# Patient Record
Sex: Female | Born: 1985
Health system: Southern US, Community
[De-identification: ages and names within clinical notes are randomized; demographics above are authoritative.]

## PROBLEM LIST (undated history)

## (undated) DIAGNOSIS — K2970 Gastritis, unspecified, without bleeding: Secondary | ICD-10-CM

## (undated) DIAGNOSIS — J45909 Unspecified asthma, uncomplicated: Secondary | ICD-10-CM

## (undated) HISTORY — PX: CERVIX SURGERY: SHX593

---

## 2013-07-15 HISTORY — PX: GYNECOLOGIC CRYOSURGERY: SHX857

## 2016-07-23 ENCOUNTER — Encounter (HOSPITAL_COMMUNITY): Payer: Self-pay | Admitting: *Deleted

## 2016-07-23 ENCOUNTER — Ambulatory Visit (HOSPITAL_COMMUNITY)
Admission: EM | Admit: 2016-07-23 | Discharge: 2016-07-23 | Disposition: A | Discharge status: Home / Self Care | Payer: BLUE CROSS/BLUE SHIELD | Attending: Family Medicine | Admitting: Family Medicine

## 2016-07-23 DIAGNOSIS — K2950 Unspecified chronic gastritis without bleeding: Secondary | ICD-10-CM | POA: Diagnosis not present

## 2016-07-23 DIAGNOSIS — K219 Gastro-esophageal reflux disease without esophagitis: Secondary | ICD-10-CM | POA: Diagnosis not present

## 2016-07-23 HISTORY — DX: Unspecified asthma, uncomplicated: J45.909

## 2016-07-23 HISTORY — DX: Gastritis, unspecified, without bleeding: K29.70

## 2016-07-23 MED ORDER — GI COCKTAIL ~~LOC~~
30.0000 mL | Freq: Once | ORAL | Status: AC
  Administered 2016-07-23: 30 mL via ORAL

## 2016-07-23 MED ORDER — GI COCKTAIL ~~LOC~~
ORAL | Status: AC
  Filled 2016-07-23: qty 30

## 2016-07-23 MED ORDER — ESOMEPRAZOLE MAGNESIUM 40 MG PO CPDR: 40.0000 mg | DELAYED_RELEASE_CAPSULE | Freq: Every day | ORAL | 0 refills | Status: DC

## 2016-07-23 NOTE — ED Notes (Signed)
maggiie  In to  Help  interpret

## 2016-07-23 NOTE — ED Provider Notes (Signed)
CSN: [id number]     Arrival date & time 07/23/16  1329 History   First MD Initiated Contact with Patient 07/23/16 1543     Chief Complaint  Patient presents with  . Abdominal Pain   (Consider location/radiation/quality/duration/timing/severity/associated sxs/prior Treatment) 31 year old female with long history of gastritis in [place] is complaining of epigastric discomfort associated with reflux and heartburn this started this morning. Denies nausea, vomiting or fevers. She states she took 2 Zantac this morning without relief. Denies pain in the lower abdomen or pelvis.      Past Medical History:  Diagnosis Date  . Asthma   . Gastritis    History reviewed. No pertinent surgical history. History reviewed. No pertinent family history. Social History  Substance Use Topics  . Smoking status: Never Smoker  . Smokeless tobacco: Never Used  . Alcohol use No   OB History    No data available     Review of Systems  Constitutional: Negative.   HENT: Negative.   Respiratory: Negative.   Cardiovascular: Negative.   Gastrointestinal: Negative for constipation, nausea and vomiting.       As per history of present illness  Genitourinary: Negative.   Skin: Negative.   Neurological: Negative.   All other systems reviewed and are negative.   Allergies  Patient has no known allergies.  Home Medications   Prior to Admission medications   Medication Sig Start Date End Date Taking? Authorizing Provider  RANITIDINE HCL PO Take by mouth.   Yes Historical Provider, MD  esomeprazole (NEXIUM) 40 MG capsule Take 1 capsule (40 mg total) by mouth daily. 07/23/16   Hayden Rasmussen, NP   Meds Ordered and Administered this Visit   Medications  gi cocktail (Maalox,Lidocaine,Donnatal) (not administered)    BP 102/64 (BP Location: Right Arm)   Pulse 62   Temp 98.5 F (36.9 C) (Oral)   Resp 18   LMP 06/30/2016   SpO2 100%  No data found.   Physical Exam  Constitutional: She is oriented  to person, place, and time. She appears well-developed and well-nourished. No distress.  Neck: Neck supple.  Cardiovascular: Normal rate, regular rhythm, normal heart sounds and intact distal pulses.   Pulmonary/Chest: Effort normal and breath sounds normal.  Abdominal: Soft. Bowel sounds are normal. She exhibits no distension.  Mild tenderness in the epigastrium. No tenderness elsewhere. No guarding or rebound.  Musculoskeletal: Normal range of motion. She exhibits no edema.  Neurological: She is alert and oriented to person, place, and time.  Skin: Skin is warm and dry.  Psychiatric: She has a normal mood and affect.  Nursing note and vitals reviewed.   Urgent Care Course   Clinical Course     Procedures (including critical care time)  Labs Review Labs Reviewed - No data to display  Imaging Review No results found.   Visual Acuity Review  Right Eye Distance:   Left Eye Distance:   Bilateral Distance:    Right Eye Near:   Left Eye Near:    Bilateral Near:         MDM   1. Gastroesophageal reflux disease, esophagitis presence not specified   2. Chronic gastritis without bleeding, unspecified gastritis type    Continue taking the Zantac twice a day. Start taking the Nexium 40 mg on a daily basis. Stop eating foods that are greasy, spicy, acidic or fatty. Do not eat within 2 hours going to bed. Your stomach problems may continue and he will need a primary  care provider for treatment and possibly additional testing. Call the telephone numbers listed in your instructions for assistance in obtaining a primary care provider. Meds ordered this encounter  Medications  . RANITIDINE HCL PO    Sig: Take by mouth.  . gi cocktail (Maalox,Lidocaine,Donnatal)  . esomeprazole (NEXIUM) 40 MG capsule    Sig: Take 1 capsule (40 mg total) by mouth daily.    Dispense:  30 capsule    Refill:  0    Order Specific Question:   Supervising Provider    Answer:   Linna HoffKINDL, JAMES D [[id number]]        Hayden Rasmussenavid Curley Hogen, NP 07/23/16 [phone]

## 2016-07-23 NOTE — ED Triage Notes (Signed)
Pt  Reports   Symptoms  Of   Shortness  Of  Breath         With symptoms    Of  Reflux     With  Onset this    Am    Pt  Has   Some  Nausea   As  Well

## 2016-07-23 NOTE — Discharge Instructions (Signed)
Continue taking the Zantac twice a day. Start taking the Nexium 40 mg on a daily basis. Stop eating foods that are greasy, spicy, acidic or fatty. Do not eat within 2 hours going to bed. Your stomach problems may continue and he will need a primary care provider for treatment and possibly additional testing. Call the telephone numbers listed in your instructions for assistance in obtaining a primary care provider.

## 2016-10-29 ENCOUNTER — Emergency Department (HOSPITAL_COMMUNITY)
Admission: EM | Admit: 2016-10-29 | Discharge: 2016-10-29 | Disposition: A | Discharge status: Home / Self Care | Payer: BLUE CROSS/BLUE SHIELD | Attending: Emergency Medicine | Admitting: Emergency Medicine

## 2016-10-29 ENCOUNTER — Emergency Department (HOSPITAL_COMMUNITY): Payer: BLUE CROSS/BLUE SHIELD

## 2016-10-29 ENCOUNTER — Encounter (HOSPITAL_COMMUNITY): Payer: Self-pay | Admitting: Emergency Medicine

## 2016-10-29 DIAGNOSIS — J45909 Unspecified asthma, uncomplicated: Secondary | ICD-10-CM | POA: Insufficient documentation

## 2016-10-29 DIAGNOSIS — Y939 Activity, unspecified: Secondary | ICD-10-CM | POA: Diagnosis not present

## 2016-10-29 DIAGNOSIS — Y92410 Unspecified street and highway as the place of occurrence of the external cause: Secondary | ICD-10-CM | POA: Insufficient documentation

## 2016-10-29 DIAGNOSIS — Y999 Unspecified external cause status: Secondary | ICD-10-CM | POA: Insufficient documentation

## 2016-10-29 DIAGNOSIS — Z79899 Other long term (current) drug therapy: Secondary | ICD-10-CM | POA: Insufficient documentation

## 2016-10-29 DIAGNOSIS — S0990XA Unspecified injury of head, initial encounter: Secondary | ICD-10-CM | POA: Diagnosis present

## 2016-10-29 DIAGNOSIS — M542 Cervicalgia: Secondary | ICD-10-CM | POA: Diagnosis not present

## 2016-10-29 DIAGNOSIS — R072 Precordial pain: Secondary | ICD-10-CM | POA: Insufficient documentation

## 2016-10-29 DIAGNOSIS — R51 Headache: Secondary | ICD-10-CM | POA: Insufficient documentation

## 2016-10-29 MED ORDER — CYCLOBENZAPRINE HCL 10 MG PO TABS: 10.0000 mg | ORAL_TABLET | Freq: Two times a day (BID) | ORAL | 0 refills | Status: DC | PRN

## 2016-10-29 MED ORDER — HYDROCODONE-ACETAMINOPHEN 5-325 MG PO TABS
1.0000 | ORAL_TABLET | Freq: Once | ORAL | Status: AC
  Administered 2016-10-29: 1 via ORAL
  Filled 2016-10-29: qty 1

## 2016-10-29 MED ORDER — OXYCODONE-ACETAMINOPHEN 5-325 MG PO TABS: 1.0000 | ORAL_TABLET | Freq: Four times a day (QID) | ORAL | 0 refills | Status: DC | PRN
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
## 2016-10-29 NOTE — ED Notes (Signed)
Bed: WTR6 Expected date:  Expected time:  Means of arrival:  Comments: 

## 2017-01-01 ENCOUNTER — Emergency Department (HOSPITAL_COMMUNITY)
Admission: EM | Admit: 2017-01-01 | Discharge: 2017-01-01 | Disposition: A | Discharge status: Home / Self Care | Payer: BLUE CROSS/BLUE SHIELD | Attending: Emergency Medicine | Admitting: Emergency Medicine

## 2017-01-01 ENCOUNTER — Encounter (HOSPITAL_COMMUNITY): Payer: Self-pay

## 2017-01-01 DIAGNOSIS — J45909 Unspecified asthma, uncomplicated: Secondary | ICD-10-CM | POA: Diagnosis not present

## 2017-01-01 DIAGNOSIS — K529 Noninfective gastroenteritis and colitis, unspecified: Secondary | ICD-10-CM | POA: Diagnosis not present

## 2017-01-01 DIAGNOSIS — R112 Nausea with vomiting, unspecified: Secondary | ICD-10-CM | POA: Diagnosis present

## 2017-01-01 LAB — COMPREHENSIVE METABOLIC PANEL
ALT: 31 U/L (ref 14–54)
AST: 31 U/L (ref 15–41)
Albumin: 4.3 g/dL (ref 3.5–5.0)
Alkaline Phosphatase: 66 U/L (ref 38–126)
Anion gap: 11 (ref 5–15)
BILIRUBIN TOTAL: 1.1 mg/dL (ref 0.3–1.2)
BUN: 14 mg/dL (ref 6–20)
CO2: 19 mmol/L — ABNORMAL LOW (ref 22–32)
Calcium: 9.2 mg/dL (ref 8.9–10.3)
Chloride: 109 mmol/L (ref 101–111)
Creatinine, Ser: 0.86 mg/dL (ref 0.44–1.00)
GFR calc Af Amer: >60 mL/min (ref >60)
Glucose, Bld: 112 mg/dL — ABNORMAL HIGH (ref 65–99)
POTASSIUM: 3.8 mmol/L (ref 3.5–5.1)
Sodium: 139 mmol/L (ref 135–145)
TOTAL PROTEIN: 7.4 g/dL (ref 6.5–8.1)

## 2017-01-01 LAB — CBC
HEMATOCRIT: 42 % (ref 36.0–46.0)
HEMOGLOBIN: 14 g/dL (ref 12.0–15.0)
MCH: 26.9 pg (ref 26.0–34.0)
MCHC: 33.3 g/dL (ref 30.0–36.0)
MCV: 80.8 fL (ref 78.0–100.0)
Platelets: 319 10*3/uL (ref 150–400)
RBC: 5.2 MIL/uL — AB (ref 3.87–5.11)
RDW: 13.9 % (ref 11.5–15.5)
WBC: 12.7 10*3/uL — AB (ref 4.0–10.5)

## 2017-01-01 LAB — LIPASE, BLOOD: LIPASE: 22 U/L (ref 11–51)

## 2017-01-01 LAB — I-STAT BETA HCG BLOOD, ED (MC, WL, AP ONLY): I-stat hCG, quantitative: <5 m[IU]/mL (ref <5)

## 2017-01-01 MED ORDER — ONDANSETRON 4 MG PO TBDP
8.0000 mg | ORAL_TABLET | Freq: Once | ORAL | Status: AC
  Administered 2017-01-01: 8 mg via ORAL
  Filled 2017-01-01: qty 2

## 2017-01-01 MED ORDER — HYDROCODONE-ACETAMINOPHEN 5-325 MG PO TABS
1.0000 | ORAL_TABLET | Freq: Once | ORAL | Status: AC
  Administered 2017-01-01: 1 via ORAL
  Filled 2017-01-01: qty 1

## 2017-01-01 MED ORDER — ONDANSETRON HCL 8 MG PO TABS: 8.0000 mg | ORAL_TABLET | Freq: Three times a day (TID) | ORAL | 0 refills | Status: DC | PRN

## 2017-01-01 NOTE — ED Triage Notes (Signed)
Pt reports abdominal pain, nausea, vomiting and diarrhea, onset this morning.

## 2017-01-01 NOTE — Discharge Instructions (Signed)
Start with a clear liquid diet, then gradually advance to bland foods over the next 2 or 3 days.

## 2017-01-01 NOTE — ED Notes (Signed)
Pt is in stable condition upon d/c and ambulates from ED. 

## 2017-01-01 NOTE — ED Notes (Addendum)
Pt took odt zofran at home one hr pta

## 2017-01-01 NOTE — ED Provider Notes (Signed)
[facility]-EMERGENCY DEPT Provider Note   CSN: [id number] Arrival date & time: 01/01/17  0803     History   Chief Complaint Chief Complaint  Patient presents with  . Abdominal Pain    HPI Patricia [name] is a 31 y.o. female.  She presents for evaluation of nausea vomiting and diarrhea, nonbloody, for several hours, with an illness similar to her child's, who was recovering at this time.  She denies abdominal pain, back pain, weakness or dizziness.  There are no other known modifying factors.  HPI  Past Medical History:  Diagnosis Date  . Asthma   . Gastritis     There are no active problems to display for this patient.   History reviewed. No pertinent surgical history.  OB History    No data available       Home Medications    Prior to Admission medications   Medication Sig Start Date End Date Taking? Authorizing Provider  ondansetron (ZOFRAN-ODT) 4 MG disintegrating tablet Take 4 mg by mouth every 8 (eight) hours as needed for nausea or vomiting.   Yes [provider]  ondansetron (ZOFRAN) 8 MG tablet Take 1 tablet (8 mg total) by mouth every 8 (eight) hours as needed for nausea or vomiting. 01/01/17   Mancel BaleWentz, Yoshito Gaza, MD    Family History No family history on file.  Social History Social History  Substance Use Topics  . Smoking status: Never Smoker  . Smokeless tobacco: Never Used  . Alcohol use No     Allergies   Patient has no known allergies.   Review of Systems Review of Systems  All other systems reviewed and are negative.    Physical Exam Updated Vital Signs BP 120/75 (BP Location: Right Arm)   Pulse 88   Temp 98.5 F (36.9 C) (Oral)   Resp 18   LMP 12/24/2016   SpO2 100%   Physical Exam  Constitutional: She is oriented to person, place, and time. She appears well-developed and well-nourished. No distress.  HENT:  Head: Normocephalic and atraumatic.  Eyes: Conjunctivae and EOM are normal. Pupils are equal, round, and reactive  to light.  Neck: Normal range of motion and phonation normal. Neck supple.  Cardiovascular: Normal rate and regular rhythm.   Pulmonary/Chest: Effort normal and breath sounds normal. She exhibits no tenderness.  Abdominal: Soft. She exhibits no distension. There is no tenderness. There is no guarding.  Musculoskeletal: Normal range of motion.  Neurological: She is alert and oriented to person, place, and time. She exhibits normal muscle tone.  Skin: Skin is warm and dry.  Psychiatric: She has a normal mood and affect. Her behavior is normal. Judgment and thought content normal.  Nursing note and vitals reviewed.    ED Treatments / Results  Labs (all labs ordered are listed, but only abnormal results are displayed) Labs Reviewed  COMPREHENSIVE METABOLIC PANEL - Abnormal; Notable for the following:       Result Value   CO2 19 (*)    Glucose, Bld 112 (*)    All other components within normal limits  CBC - Abnormal; Notable for the following:    WBC 12.7 (*)    RBC 5.20 (*)    All other components within normal limits  LIPASE, BLOOD  URINALYSIS, ROUTINE W REFLEX MICROSCOPIC  I-STAT BETA HCG BLOOD, ED ([facility], [facility], [facility] ONLY)    EKG  EKG Interpretation None       Radiology No results found.  Procedures Procedures (including critical care  time)  Medications Ordered in ED Medications  HYDROcodone-acetaminophen (NORCO/VICODIN) 5-325 MG per tablet 1 tablet (1 tablet Oral Given 01/01/17 1006)  ondansetron (ZOFRAN-ODT) disintegrating tablet 8 mg (8 mg Oral Given 01/01/17 1005)     Initial Impression / Assessment and Plan / ED Course  I have reviewed the triage vital signs and the nursing notes.  Pertinent labs & imaging results that were available during my care of the patient were reviewed by me and considered in my medical decision making (see chart for details).      Patient Vitals for the past 24 hrs:  BP Temp Temp src Pulse Resp SpO2  01/01/17 0818 120/75 98.5 F (36.9  C) Oral 88 18 100 %    10:59 AM Reevaluation with update and discussion. After initial assessment and treatment, an updated evaluation reveals she feels "regular", at this time.  Findings discussed with the patient and all questions were answered. Patricia [name]    Final Clinical Impressions(s) / ED Diagnoses   Final diagnoses:  Gastroenteritis    Evaluation consistent with infectious gastroenteritis.  Patient improved with treatment, and stable for discharge.  Doubt serious bacterial infection, metabolic instability or impending vascular collapse.  Nursing Notes Reviewed/ Care Coordinated Applicable Imaging Reviewed Interpretation of Laboratory Data incorporated into ED treatment  The patient appears reasonably screened and/or stabilized for discharge and I doubt any other medical condition or other [facility] requiring further screening, evaluation, or treatment in the ED at this time prior to discharge.  Plan: Home Medications-OTC, as needed; Home Treatments-gradually advance diet; return here if the recommended treatment, does not improve the symptoms; Recommended follow up-PCP as needed    New Prescriptions New Prescriptions   ONDANSETRON (ZOFRAN) 8 MG TABLET    Take 1 tablet (8 mg total) by mouth every 8 (eight) hours as needed for nausea or vomiting.     Mancel Bale, MD 01/01/17 1110

## 2017-04-28 ENCOUNTER — Ambulatory Visit (HOSPITAL_COMMUNITY)
Admission: EM | Admit: 2017-04-28 | Discharge: 2017-04-28 | Disposition: A | Discharge status: Home / Self Care | Payer: BLUE CROSS/BLUE SHIELD | Attending: Emergency Medicine | Admitting: Emergency Medicine

## 2017-04-28 ENCOUNTER — Encounter (HOSPITAL_COMMUNITY): Payer: Self-pay | Admitting: Emergency Medicine

## 2017-04-28 DIAGNOSIS — R1084 Generalized abdominal pain: Secondary | ICD-10-CM | POA: Diagnosis not present

## 2017-04-28 DIAGNOSIS — K5900 Constipation, unspecified: Secondary | ICD-10-CM

## 2017-04-28 MED ORDER — POLYETHYLENE GLYCOL 3350 17 G PO PACK: 17.0000 g | PACK | Freq: Every day | ORAL | 0 refills | Status: DC

## 2017-04-28 MED ORDER — DOCUSATE SODIUM 50 MG PO CAPS: 50.0000 mg | ORAL_CAPSULE | Freq: Two times a day (BID) | ORAL | 0 refills | Status: DC

## 2017-04-28 NOTE — Discharge Instructions (Signed)
No alarming signs on exam. Symptoms could be due to constipation, viral illness, acid reflux. Take colace and miralax for constipation. You can continue zantac for acid reflux. I have attached information for acid reflux and foods to avoid. Keep hydrated, you urine should be clear to pale yellow in color. Follow up with PCP if symptoms do not improve. If experiencing worsening of symptoms, nausea/vomiting, blood in vomit, increased blood in stool, weakness, dizziness, go to the emergency department for further evaluation.

## 2017-04-28 NOTE — ED Triage Notes (Signed)
Pt sts rectal pain and some abd pain; pt sts hx of gastritis

## 2017-04-28 NOTE — ED Provider Notes (Signed)
[facility]    CSN: [id number] Arrival date & time: 04/28/17  1547     History   Chief Complaint Chief Complaint  Patient presents with  . Rectal Pain  . Abdominal Pain    HPI Patricia [name] is a 31 y.o. female.   31 year old female with history of asthma comes in for 1 day history of low abdominal pain and rectal pain. Low abdominal pain and rectal pain is intermittent and occurs together. She is also experiencing epigastric pain as well, states pain is worse with eating. Took some zantac without relief. Last BM this morning, straining with hard stools. Points to bristol chart type 2. Notices occasional blood when wiping. Had some nausea without vomiting yesterday. Has not had anything to eat today, but does feel hungry. Denies urinary symptoms such as frequency, dysuria, heamturia. Denies fever, chills, night sweats. Denies weakness, dizziness. No sick contact.       Past Medical History:  Diagnosis Date  . Asthma   . Gastritis     There are no active problems to display for this patient.   History reviewed. No pertinent surgical history.  OB History    No data available       Home Medications    Prior to Admission medications   Medication Sig Start Date End Date Taking? Authorizing Provider  docusate sodium (COLACE) 50 MG capsule Take 1 capsule (50 mg total) by mouth 2 (two) times daily. 04/28/17   Cathie Hoops, Amy V, PA-C  ondansetron (ZOFRAN) 8 MG tablet Take 1 tablet (8 mg total) by mouth every 8 (eight) hours as needed for nausea or vomiting. 01/01/17   Mancel Bale, MD  ondansetron (ZOFRAN-ODT) 4 MG disintegrating tablet Take 4 mg by mouth every 8 (eight) hours as needed for nausea or vomiting.    [provider]  polyethylene glycol (MIRALAX) packet Take 17 g by mouth daily. 04/28/17   Belinda Fisher, PA-C    Family History History reviewed. No pertinent family history.  Social History Social History  Substance Use Topics  . Smoking status:  Never Smoker  . Smokeless tobacco: Never Used  . Alcohol use No     Allergies   Patient has no known allergies.   Review of Systems Review of Systems  Reason unable to perform ROS: See HPI as above.     Physical Exam Triage Vital Signs ED Triage Vitals [04/28/17 1628]  Enc Vitals Group     BP 114/65     Pulse Rate 74     Resp 18     Temp 98.3 F (36.8 C)     Temp Source Oral     SpO2 100 %     Weight      Height      Head Circumference      Peak Flow      Pain Score 4     Pain Loc      Pain Edu?      Excl. in GC?    No data found.   Updated Vital Signs BP 114/65 (BP Location: Right Arm)   Pulse 74   Temp 98.3 F (36.8 C) (Oral)   Resp 18   SpO2 100%   Physical Exam  Constitutional: She is oriented to person, place, and time. She appears well-developed and well-nourished. No distress.  HENT:  Head: Normocephalic and atraumatic.  Eyes: Pupils are equal, round, and reactive to light. Conjunctivae are normal.  Cardiovascular: Normal rate, regular  rhythm and normal heart sounds.  Exam reveals no gallop and no friction rub.   No murmur heard. Pulmonary/Chest: Effort normal and breath sounds normal. She has no wheezes. She has no rales.  Abdominal: Soft. Bowel sounds are normal. She exhibits no mass. There is tenderness (generalized tenderness on palpation). There is no rebound, no guarding and no CVA tenderness.  Neurological: She is alert and oriented to person, place, and time.  Skin: Skin is warm and dry.  Psychiatric: She has a normal mood and affect. Her behavior is normal. Judgment normal.     UC Treatments / Results  Labs (all labs ordered are listed, but only abnormal results are displayed) Labs Reviewed - No data to display  EKG  EKG Interpretation None       Radiology No results found.  Procedures Procedures (including critical care time)  Medications Ordered in UC Medications - No data to display   Initial Impression /  Assessment and Plan / UC Course  I have reviewed the triage vital signs and the nursing notes.  Pertinent labs & imaging results that were available during my care of the patient were reviewed by me and considered in my medical decision making (see chart for details).    No alarming signs on exam. Discussed possible causes of symptoms such as constipation, viral illness, acid reflux. Start colace and miralax for constipation. Push fluids. Return precautions given.   Final Clinical Impressions(s) / UC Diagnoses   Final diagnoses:  Generalized abdominal pain  Constipation, unspecified constipation type    New Prescriptions New Prescriptions   DOCUSATE SODIUM (COLACE) 50 MG CAPSULE    Take 1 capsule (50 mg total) by mouth 2 (two) times daily.   POLYETHYLENE GLYCOL (MIRALAX) PACKET    Take 17 g by mouth daily.      Belinda Fisher, PA-C 04/28/17 1757

## 2017-06-24 ENCOUNTER — Encounter (HOSPITAL_COMMUNITY): Payer: Self-pay | Admitting: Emergency Medicine

## 2017-06-24 ENCOUNTER — Other Ambulatory Visit: Payer: Self-pay

## 2017-06-24 ENCOUNTER — Ambulatory Visit (HOSPITAL_COMMUNITY)
Admission: EM | Admit: 2017-06-24 | Discharge: 2017-06-24 | Disposition: A | Discharge status: Home / Self Care | Payer: BLUE CROSS/BLUE SHIELD | Attending: Internal Medicine | Admitting: Internal Medicine

## 2017-06-24 DIAGNOSIS — M79672 Pain in left foot: Secondary | ICD-10-CM

## 2017-06-24 MED ORDER — MELOXICAM 7.5 MG PO TABS: 7.5000 mg | ORAL_TABLET | Freq: Every day | ORAL | 0 refills | Status: DC

## 2017-06-24 NOTE — ED Provider Notes (Signed)
[facility]    CSN: [id number] Arrival date & time: 06/24/17  1837     History   Chief Complaint Chief Complaint  Patient presents with  . Foot Pain    left    HPI Patricia [name] is a 31 y.o. female.   31 year old female comes in for 2 day history of left heel pain. States she woke up with the pain, denies any injuries. Pain is mostly medial side of the heel, but now wraps around to the lateral side as well. States that pain is now constant and worse with weight bearing. Patient noticed a red area on her medial heel that is tender to palpation. Denies spreading redness, increased warmth, fever. Has not tried anything for the symptoms. Patient's work requires long hours of standing and walking.       Past Medical History:  Diagnosis Date  . Asthma   . Gastritis     There are no active problems to display for this patient.   History reviewed. No pertinent surgical history.  OB History    No data available       Home Medications    Prior to Admission medications   Medication Sig Start Date End Date Taking? Authorizing Provider  docusate sodium (COLACE) 50 MG capsule Take 1 capsule (50 mg total) by mouth 2 (two) times daily. 04/28/17   Cathie HoopsYu, Amy V, PA-C  meloxicam (MOBIC) 7.5 MG tablet Take 1 tablet (7.5 mg total) by mouth daily. 06/24/17   Cathie HoopsYu, Amy V, PA-C  ondansetron (ZOFRAN) 8 MG tablet Take 1 tablet (8 mg total) by mouth every 8 (eight) hours as needed for nausea or vomiting. 01/01/17   Mancel BaleWentz, Elliott, MD  ondansetron (ZOFRAN-ODT) 4 MG disintegrating tablet Take 4 mg by mouth every 8 (eight) hours as needed for nausea or vomiting.    [provider]  polyethylene glycol (MIRALAX) packet Take 17 g by mouth daily. 04/28/17   Belinda FisherYu, Amy V, PA-C    Family History History reviewed. No pertinent family history.  Social History Social History   Tobacco Use  . Smoking status: Never Smoker  . Smokeless tobacco: Never Used  Substance Use Topics  .  Alcohol use: No  . Drug use: Not on file     Allergies   Patient has no known allergies.   Review of Systems Review of Systems  Reason unable to perform ROS: See HPI as above.     Physical Exam Triage Vital Signs ED Triage Vitals  Enc Vitals Group     BP 06/24/17 1848 108/78     Pulse Rate 06/24/17 1848 78     Resp --      Temp 06/24/17 1848 98.1 F (36.7 C)     Temp Source 06/24/17 1848 Oral     SpO2 06/24/17 1848 98 %     Weight --      Height --      Head Circumference --      Peak Flow --      Pain Score 06/24/17 1847 8     Pain Loc --      Pain Edu? --      Excl. in GC? --    No data found.  Updated Vital Signs BP 108/78 (BP Location: Left Arm)   Pulse 78   Temp 98.1 F (36.7 C) (Oral)   LMP 06/19/2017 (Exact Date)   SpO2 98%   Physical Exam  Constitutional: She is oriented to person,  place, and time. She appears well-developed and well-nourished. No distress.  HENT:  Head: Normocephalic and atraumatic.  Eyes: Conjunctivae are normal. Pupils are equal, round, and reactive to light.  Musculoskeletal:  Round 0.5cm erythema at the left medial ankle inferior to the medial malleolus. Tender to palpation. No increased warmth, no induration felt. Diffuse tenderness on palpation of the heel. Increased tenderness with dorsiflexion of toes. Full ROM of ankle, strength normal and equal bilaterally. Sensation intact and equal bilaterally. Pedal pulses 2+, cap refill <2s  Neurological: She is alert and oriented to person, place, and time.     UC Treatments / Results  Labs (all labs ordered are listed, but only abnormal results are displayed) Labs Reviewed - No data to display  EKG  EKG Interpretation None       Radiology No results found.  Procedures Procedures (including critical care time)  Medications Ordered in UC Medications - No data to display   Initial Impression / Assessment and Plan / UC Course  I have reviewed the triage vital signs  and the nursing notes.  Pertinent labs & imaging results that were available during my care of the patient were reviewed by me and considered in my medical decision making (see chart for details).    Discussed possible tendinitis/plantar fasciitis causing symptoms. NSAIDs, ice compress, supportive shoes. Return precautions given.   Final Clinical Impressions(s) / UC Diagnoses   Final diagnoses:  Pain of left heel    ED Discharge Orders        Ordered    meloxicam (MOBIC) 7.5 MG tablet  Daily     06/24/17 1919       Lurline IdolYu, Amy V, PA-C 06/24/17 2016

## 2017-06-24 NOTE — Discharge Instructions (Signed)
History and exam most consistent with inflammation of your tendon. Start mobic as directed. Ice compress and elevation. Better supportive shoes for your arch. You may need further treatment given your long standing and walking hours in your job. You can follow up with orthopedics for further evaluation and management needed.  No signs of infection right now. Please monitor for worsening redness, increased warmth, pain, fever, follow up for reevaluation.

## 2017-06-24 NOTE — ED Triage Notes (Signed)
Pt reports pain in her medial left foot close to the heel that started yesterday.  Today she states that there is a bump in the spot that hurts the most.  She denies any injury.

## 2017-07-14 ENCOUNTER — Ambulatory Visit: Payer: BLUE CROSS/BLUE SHIELD | Admitting: Emergency Medicine

## 2017-07-14 ENCOUNTER — Encounter: Payer: Self-pay | Admitting: Emergency Medicine

## 2017-07-14 ENCOUNTER — Other Ambulatory Visit: Payer: Self-pay

## 2017-07-14 VITALS — BP 108/62 | HR 75 | Temp 98.5°F | Resp 16 | Ht 60.25 in | Wt 189.4 lb

## 2017-07-14 DIAGNOSIS — R040 Epistaxis: Secondary | ICD-10-CM | POA: Insufficient documentation

## 2017-07-14 DIAGNOSIS — R635 Abnormal weight gain: Secondary | ICD-10-CM

## 2017-07-14 DIAGNOSIS — R51 Headache: Secondary | ICD-10-CM | POA: Diagnosis not present

## 2017-07-14 DIAGNOSIS — M542 Cervicalgia: Secondary | ICD-10-CM

## 2017-07-14 DIAGNOSIS — G8929 Other chronic pain: Secondary | ICD-10-CM | POA: Insufficient documentation

## 2017-07-14 DIAGNOSIS — R519 Headache, unspecified: Secondary | ICD-10-CM | POA: Insufficient documentation

## 2017-07-14 NOTE — Patient Instructions (Addendum)
IF you received an x-ray today, you will receive an invoice from [facility]. Please contact [facility] at [phone] with questions or concerns regarding your invoice.   IF you received labwork today, you will receive an invoice from [facility]. Please contact [facility] at [phone] with questions or concerns regarding your invoice.   Our billing staff will not be able to assist you with questions regarding bills from these companies.  You will be contacted with the lab results as soon as they are available. The fastest way to get your results is to activate your My Chart account. Instructions are located on the last page of this paperwork. If you have not heard from [place] regarding the results in 2 weeks, please contact this office.     General Headache Without Cause A headache is pain or discomfort felt around the head or neck area. There are many causes and types of headaches. In some cases, the cause may not be found. Follow these instructions at home: Managing pain  Take over-the-counter and prescription medicines only as told by your doctor.  Lie down in a dark, quiet room when you have a headache.  If directed, apply ice to the head and neck area: ? Put ice in a plastic bag. ? Place a towel between your skin and the bag. ? Leave the ice on for 20 minutes, 2-3 times per day.  Use a heating pad or hot shower to apply heat to the head and neck area as told by your doctor.  Keep lights dim if bright lights bother you or make your headaches worse. Eating and drinking  Eat meals on a regular schedule.  Lessen how much alcohol you drink.  Lessen how much caffeine you drink, or stop drinking caffeine. General instructions  Keep all follow-up visits as told by your doctor. This is important.  Keep a journal to find out if certain things bring on headaches. For example, write down: ? What you eat and drink. ? How much sleep you get. ? Any change to  your diet or medicines.  Relax by getting a massage or doing other relaxing activities.  Lessen stress.  Sit up straight. Do not tighten (tense) your muscles.  Do not use tobacco products. This includes cigarettes, chewing tobacco, or e-cigarettes. If you need help quitting, ask your doctor.  Exercise regularly as told by your doctor.  Get enough sleep. This often means 7-9 hours of sleep. Contact a doctor if:  Your symptoms are not helped by medicine.  You have a headache that feels different than the other headaches.  You feel sick to your stomach (nauseous) or you throw up (vomit).  You have a fever. Get help right away if:  Your headache becomes really bad.  You keep throwing up.  You have a stiff neck.  You have trouble seeing.  You have trouble speaking.  You have pain in the eye or ear.  Your muscles are weak or you lose muscle control.  You lose your balance or have trouble walking.  You feel like you will pass out (faint) or you pass out.  You have confusion. This information is not intended to replace advice given to you by your health care provider. Make sure you discuss any questions you have with your health care provider. Document Released: 04/09/2008 Document Revised: 12/07/2015 Document Reviewed: 10/24/2014 Elsevier Interactive Patient Education  2018 [place], Adult A nosebleed is when blood comes out of the nose.  Nosebleeds are common. Usually, they are not a sign of a serious condition. Nosebleeds can happen if a small blood vessel in your nose starts to bleed or if the lining of your nose (mucous membrane) cracks. They are commonly caused by:  Allergies.  Colds.  Picking your nose.  Blowing your nose too hard.  An injury from sticking an object into your nose or getting hit in the nose.  Dry or cold air.  Less common causes of nosebleeds include:  Toxic fumes.  Something abnormal in the nose or in the air-filled  spaces in the bones of the face (sinuses).  Growths in the nose, such as polyps.  Medicines or conditions that cause blood to clot slowly.  Certain illnesses or procedures that irritate or dry out the nasal passages.  Follow these instructions at home: When you have a nosebleed:  Sit down and tilt your head slightly forward.  Use a clean towel or tissue to pinch your nostrils under the bony part of your nose. After 10 minutes, let go of your nose and see if bleeding starts again. Do not release pressure before that time. If there is still bleeding, repeat the pinching and holding for 10 minutes until the bleeding stops.  Do not place tissues or gauze in the nose to stop bleeding.  Avoid lying down and avoid tilting your head backward. That may make blood collect in the throat and cause gagging or coughing.  Use a nasal spray decongestant to help with a nosebleed as told by your health care provider.  Do not use petroleum jelly or mineral oil in your nose. It can drip into your lungs. After a nosebleed:  Avoid blowing your nose or sniffing for a number of hours.  Avoid straining, lifting, or bending at the waist for several days. You may resume other normal activities as you are able.  Use saline spray or a humidifier as told by your health care provider.  Aspirinand blood thinners make bleeding more likely. If you are prescribed these medicines and you suffer from nosebleeds: ? Ask your health care provider if you should stop taking the medicines or if you should adjust the dose. ? Do not stop taking medicines that your health care provider has recommended unless told by your health care provider.  If your nosebleed was caused by dry mucous membranes, use over-the-counter saline nasal spray or gel. This will keep the mucous membranes moist and allow them to heal. If you must use a lubricant: ? Choose one that is water-soluble. ? Use only as much as you need and use it only as  often as needed. ? Do not lie down until several hours after you use it. Contact a health care provider if:  You have a fever.  You get nosebleeds often or more often than usual.  You bruise very easily.  You have a nosebleed from having something stuck in your nose.  You have bleeding in your mouth.  You vomit or cough up brown material.  You have a nosebleed after you start a new medicine. Get help right away if:  You have a nosebleed after a fall or a head injury.  Your nosebleed does not go away after 20 minutes.  You feel dizzy or weak.  You have unusual bleeding from other parts of your body.  You have unusual bruising on other parts of your body.  You become sweaty.  You vomit blood. This information is not intended to replace advice  given to you by your health care provider. Make sure you discuss any questions you have with your health care provider. Document Released: 04/10/2005 Document Revised: 02/29/2016 Document Reviewed: 01/16/2016 Elsevier Interactive Patient Education  2018 [place] Maintenance, Female Adopting a healthy lifestyle and getting preventive care can go a long way to promote health and wellness. Talk with your health care provider about what schedule of regular examinations is right for you. This is a good chance for you to check in with your provider about disease prevention and staying healthy. In between checkups, there are plenty of things you can do on your own. Experts have done a lot of research about which lifestyle changes and preventive measures are most likely to keep you healthy. Ask your health care provider for more information. Weight and diet Eat a healthy diet  Be sure to include plenty of vegetables, fruits, low-fat dairy products, and lean protein.  Do not eat a lot of foods high in solid fats, added sugars, or salt.  Get regular exercise. This is one of the most important things you can do for your  health. ? Most adults should exercise for at least 150 minutes each week. The exercise should increase your heart rate and make you sweat (moderate-intensity exercise). ? Most adults should also do strengthening exercises at least twice a week. This is in addition to the moderate-intensity exercise.  Maintain a healthy weight  Body mass index (BMI) is a measurement that can be used to identify possible weight problems. It estimates body fat based on height and weight. Your health care provider can help determine your BMI and help you achieve or maintain a healthy weight.  For females 22 years of age and older: ? A BMI below 18.5 is considered underweight. ? A BMI of 18.5 to 24.9 is normal. ? A BMI of 25 to 29.9 is considered overweight. ? A BMI of 30 and above is considered obese.  Watch levels of cholesterol and blood lipids  You should start having your blood tested for lipids and cholesterol at 29 years of age, then have this test every 5 years.  You may need to have your cholesterol levels checked more often if: ? Your lipid or cholesterol levels are high. ? You are older than 29 years of age. ? You are at high risk for heart disease.  Cancer screening Lung Cancer  Lung cancer screening is recommended for adults 40-17 years old who are at high risk for lung cancer because of a history of smoking.  A yearly low-dose CT scan of the lungs is recommended for people who: ? Currently smoke. ? Have quit within the past 15 years. ? Have at least a 30-pack-year history of smoking. A pack year is smoking an average of one pack of cigarettes a day for 1 year.  Yearly screening should continue until it has been 15 years since you quit.  Yearly screening should stop if you develop a health problem that would prevent you from having lung cancer treatment.  Breast Cancer  Practice breast self-awareness. This means understanding how your breasts normally appear and feel.  It also means  doing regular breast self-exams. Let your health care provider know about any changes, no matter how small.  If you are in your 20s or 30s, you should have a clinical breast exam (CBE) by a health care provider every 1-3 years as part of a regular health exam.  If you are 40 or older, have  a CBE every year. Also consider having a breast X-ray (mammogram) every year.  If you have a family history of breast cancer, talk to your health care provider about genetic screening.  If you are at high risk for breast cancer, talk to your health care provider about having an MRI and a mammogram every year.  Breast cancer gene (BRCA) assessment is recommended for women who have family members with BRCA-related cancers. BRCA-related cancers include: ? Breast. ? Ovarian. ? Tubal. ? Peritoneal cancers.  Results of the assessment will determine the need for genetic counseling and BRCA1 and BRCA2 testing.  Cervical Cancer Your health care provider may recommend that you be screened regularly for cancer of the pelvic organs (ovaries, uterus, and vagina). This screening involves a pelvic examination, including checking for microscopic changes to the surface of your cervix (Pap test). You may be encouraged to have this screening done every 3 years, beginning at age 41.  For women ages 61-65, health care providers may recommend pelvic exams and Pap testing every 3 years, or they may recommend the Pap and pelvic exam, combined with testing for human papilloma virus (HPV), every 5 years. Some types of HPV increase your risk of cervical cancer. Testing for HPV may also be done on women of any age with unclear Pap test results.  Other health care providers may not recommend any screening for nonpregnant women who are considered low risk for pelvic cancer and who do not have symptoms. Ask your health care provider if a screening pelvic exam is right for you.  If you have had past treatment for cervical cancer or a  condition that could lead to cancer, you need Pap tests and screening for cancer for at least 20 years after your treatment. If Pap tests have been discontinued, your risk factors (such as having a new sexual partner) need to be reassessed to determine if screening should resume. Some women have medical problems that increase the chance of getting cervical cancer. In these cases, your health care provider may recommend more frequent screening and Pap tests.  Colorectal Cancer  This type of cancer can be detected and often prevented.  Routine colorectal cancer screening usually begins at 29 years of age and continues through 29 years of age.  Your health care provider may recommend screening at an earlier age if you have risk factors for colon cancer.  Your health care provider may also recommend using home test kits to check for hidden blood in the stool.  A small camera at the end of a tube can be used to examine your colon directly (sigmoidoscopy or colonoscopy). This is done to check for the earliest forms of colorectal cancer.  Routine screening usually begins at age 29.  Direct examination of the colon should be repeated every 5-10 years through 29 years of age. However, you may need to be screened more often if early forms of precancerous polyps or small growths are found.  Skin Cancer  Check your skin from head to toe regularly.  Tell your health care provider about any new moles or changes in moles, especially if there is a change in a mole's shape or color.  Also tell your health care provider if you have a mole that is larger than the size of a pencil eraser.  Always use sunscreen. Apply sunscreen liberally and repeatedly throughout the day.  Protect yourself by wearing long sleeves, pants, a wide-brimmed hat, and sunglasses whenever you are outside.  Heart disease,  diabetes, and high blood pressure  High blood pressure causes heart disease and increases the risk of stroke.  High blood pressure is more likely to develop in: ? People who have blood pressure in the high end of the normal range (130-139/85-89 mm Hg). ? People who are overweight or obese. ? People who are African American.  If you are 60-93 years of age, have your blood pressure checked every 3-5 years. If you are 43 years of age or older, have your blood pressure checked every year. You should have your blood pressure measured twice-once when you are at a hospital or clinic, and once when you are not at a hospital or clinic. Record the average of the two measurements. To check your blood pressure when you are not at a hospital or clinic, you can use: ? An automated blood pressure machine at a pharmacy. ? A home blood pressure monitor.  If you are between 2 years and 98 years old, ask your health care provider if you should take aspirin to prevent strokes.  Have regular diabetes screenings. This involves taking a blood sample to check your fasting blood sugar level. ? If you are at a normal weight and have a low risk for diabetes, have this test once every three years after 29 years of age. ? If you are overweight and have a high risk for diabetes, consider being tested at a younger age or more often. Preventing infection Hepatitis B  If you have a higher risk for hepatitis B, you should be screened for this virus. You are considered at high risk for hepatitis B if: ? You were born in a country where hepatitis B is common. Ask your health care provider which countries are considered high risk. ? Your parents were born in a high-risk country, and you have not been immunized against hepatitis B (hepatitis B vaccine). ? You have HIV or AIDS. ? You use needles to inject street drugs. ? You live with someone who has hepatitis B. ? You have had sex with someone who has hepatitis B. ? You get hemodialysis treatment. ? You take certain medicines for conditions, including cancer, organ transplantation, and  autoimmune conditions.  Hepatitis C  Blood testing is recommended for: ? Everyone born from [birth year] through 1963. ? Anyone with known risk factors for hepatitis C.  Sexually transmitted infections (STIs)  You should be screened for sexually transmitted infections (STIs) including gonorrhea and chlamydia if: ? You are sexually active and are younger than 29 years of age. ? You are older than 29 years of age and your health care provider tells you that you are at risk for this type of infection. ? Your sexual activity has changed since you were last screened and you are at an increased risk for chlamydia or gonorrhea. Ask your health care provider if you are at risk.  If you do not have HIV, but are at risk, it may be recommended that you take a prescription medicine daily to prevent HIV infection. This is called pre-exposure prophylaxis (PrEP). You are considered at risk if: ? You are sexually active and do not regularly use condoms or know the HIV status of your partner(s). ? You take drugs by injection. ? You are sexually active with a partner who has HIV.  Talk with your health care provider about whether you are at high risk of being infected with HIV. If you choose to begin PrEP, you should first be tested for HIV. You should  then be tested every 3 months for as long as you are taking PrEP. Pregnancy  If you are premenopausal and you may become pregnant, ask your health care provider about preconception counseling.  If you may become pregnant, take 400 to 800 micrograms (mcg) of folic acid every day.  If you want to prevent pregnancy, talk to your health care provider about birth control (contraception). Osteoporosis and menopause  Osteoporosis is a disease in which the bones lose minerals and strength with aging. This can result in serious bone fractures. Your risk for osteoporosis can be identified using a bone density scan.  If you are 59 years of age or older, or if you are at  risk for osteoporosis and fractures, ask your health care provider if you should be screened.  Ask your health care provider whether you should take a calcium or vitamin D supplement to lower your risk for osteoporosis.  Menopause may have certain physical symptoms and risks.  Hormone replacement therapy may reduce some of these symptoms and risks. Talk to your health care provider about whether hormone replacement therapy is right for you. Follow these instructions at home:  Schedule regular health, dental, and eye exams.  Stay current with your immunizations.  Do not use any tobacco products including cigarettes, chewing tobacco, or electronic cigarettes.  If you are pregnant, do not drink alcohol.  If you are breastfeeding, limit how much and how often you drink alcohol.  Limit alcohol intake to no more than 1 drink per day for nonpregnant women. One drink equals 12 ounces of beer, 5 ounces of wine, or 1 ounces of hard liquor.  Do not use street drugs.  Do not share needles.  Ask your health care provider for help if you need support or information about quitting drugs.  Tell your health care provider if you often feel depressed.  Tell your health care provider if you have ever been abused or do not feel safe at home. This information is not intended to replace advice given to you by your health care provider. Make sure you discuss any questions you have with your health care provider. Document Released: 01/14/2011 Document Revised: 12/07/2015 Document Reviewed: 04/04/2015 Elsevier Interactive Patient Education  [employer].

## 2017-07-14 NOTE — Progress Notes (Signed)
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 with your health care provider. Document Released: 01/14/2011 Document Revised: 12/07/2015 Document Reviewed: 04/04/2015 Elsevier Interactive Patient Education  2018 Elsevier Inc.      Agustina Caroli, MD [facility]

## 2017-07-15 LAB — HEMOGLOBIN A1C
Est. average glucose Bld gHb Est-mCnc: 108 mg/dL
HEMOGLOBIN A1C: 5.4 % (ref 4.8–5.6)

## 2017-07-15 LAB — CBC WITH DIFFERENTIAL/PLATELET
BASOS ABS: 0 10*3/uL (ref 0.0–0.2)
BASOS: 0 %
EOS (ABSOLUTE): 0.2 10*3/uL (ref 0.0–0.4)
Eos: 2 %
Hematocrit: 40.5 % (ref 34.0–46.6)
Hemoglobin: 14 g/dL (ref 11.1–15.9)
IMMATURE GRANS (ABS): 0 10*3/uL (ref 0.0–0.1)
Immature Granulocytes: 0 %
LYMPHS ABS: 2.7 10*3/uL (ref 0.7–3.1)
LYMPHS: 30 %
MCH: 27.1 pg (ref 26.6–33.0)
MCHC: 34.6 g/dL (ref 31.5–35.7)
MCV: 79 fL (ref 79–97)
MONOS ABS: 0.6 10*3/uL (ref 0.1–0.9)
Monocytes: 7 %
NEUTROS ABS: 5.5 10*3/uL (ref 1.4–7.0)
Neutrophils: 61 %
PLATELETS: 364 10*3/uL (ref 150–379)
RBC: 5.16 x10E6/uL (ref 3.77–5.28)
RDW: 14.6 % (ref 12.3–15.4)
WBC: 9 10*3/uL (ref 3.4–10.8)

## 2017-07-15 LAB — COMPREHENSIVE METABOLIC PANEL
A/G RATIO: 1.7 (ref 1.2–2.2)
ALK PHOS: 72 IU/L (ref 39–117)
ALT: 20 IU/L (ref 0–32)
AST: 17 IU/L (ref 0–40)
Albumin: 4.6 g/dL (ref 3.5–5.5)
BILIRUBIN TOTAL: 0.4 mg/dL (ref 0.0–1.2)
BUN / CREAT RATIO: 14 (ref 9–23)
BUN: 10 mg/dL (ref 6–20)
CHLORIDE: 103 mmol/L (ref 96–106)
CO2: 20 mmol/L (ref 20–29)
Calcium: 9.4 mg/dL (ref 8.7–10.2)
Creatinine, Ser: 0.69 mg/dL (ref 0.57–1.00)
GFR calc non Af Amer: 118 mL/min/{1.73_m2} (ref >59)
GFR, EST AFRICAN AMERICAN: 136 mL/min/{1.73_m2} (ref >59)
GLUCOSE: 88 mg/dL (ref 65–99)
Globulin, Total: 2.7 g/dL (ref 1.5–4.5)
POTASSIUM: 4.2 mmol/L (ref 3.5–5.2)
Sodium: 138 mmol/L (ref 134–144)
TOTAL PROTEIN: 7.3 g/dL (ref 6.0–8.5)

## 2017-07-15 LAB — SEDIMENTATION RATE: SED RATE: 5 mm/h (ref 0–32)

## 2017-07-15 LAB — TSH: TSH: 1.36 u[IU]/mL (ref 0.450–4.500)

## 2017-08-08 ENCOUNTER — Encounter (INDEPENDENT_AMBULATORY_CARE_PROVIDER_SITE_OTHER): Payer: Self-pay | Admitting: Orthopaedic Surgery

## 2017-08-08 ENCOUNTER — Ambulatory Visit (INDEPENDENT_AMBULATORY_CARE_PROVIDER_SITE_OTHER): Payer: BLUE CROSS/BLUE SHIELD

## 2017-08-08 ENCOUNTER — Ambulatory Visit (INDEPENDENT_AMBULATORY_CARE_PROVIDER_SITE_OTHER): Payer: BLUE CROSS/BLUE SHIELD | Admitting: Orthopaedic Surgery

## 2017-08-08 VITALS — BP 109/66 | HR 81 | Ht 60.0 in | Wt 190.0 lb

## 2017-08-08 DIAGNOSIS — G8929 Other chronic pain: Secondary | ICD-10-CM | POA: Diagnosis not present

## 2017-08-08 DIAGNOSIS — M545 Low back pain, unspecified: Secondary | ICD-10-CM
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
## 2017-08-11 ENCOUNTER — Encounter (INDEPENDENT_AMBULATORY_CARE_PROVIDER_SITE_OTHER): Payer: Self-pay | Admitting: Orthopaedic Surgery

## 2017-08-15 ENCOUNTER — Ambulatory Visit: Payer: BLUE CROSS/BLUE SHIELD | Admitting: Emergency Medicine

## 2017-08-15 ENCOUNTER — Encounter: Payer: Self-pay | Admitting: Emergency Medicine

## 2017-08-15 ENCOUNTER — Other Ambulatory Visit: Payer: Self-pay

## 2017-08-15 VITALS — BP 102/60 | HR 80 | Temp 97.9°F | Resp 16 | Ht 59.75 in | Wt 186.8 lb

## 2017-08-15 DIAGNOSIS — L309 Dermatitis, unspecified: Secondary | ICD-10-CM

## 2017-08-15 DIAGNOSIS — L259 Unspecified contact dermatitis, unspecified cause: Secondary | ICD-10-CM | POA: Diagnosis not present

## 2017-08-15 DIAGNOSIS — Z87898 Personal history of other specified conditions: Secondary | ICD-10-CM | POA: Diagnosis not present

## 2017-08-15 DIAGNOSIS — Z8742 Personal history of other diseases of the female genital tract: Secondary | ICD-10-CM

## 2017-08-15 MED ORDER — TRIAMCINOLONE ACETONIDE 0.1 % EX CREA: 1.0000 "application " | TOPICAL_CREAM | Freq: Two times a day (BID) | CUTANEOUS | 0 refills | Status: DC

## 2017-08-15 NOTE — Progress Notes (Signed)
Patricia MallJaneilly [name] 32 y.o.   Chief Complaint  Patient presents with  . Epistaxis    follow up - per patient she nosebleed 08/14/2017  . Hand Problem    per patient dry hands-started December 2018    HISTORY OF PRESENT ILLNESS: This is a 32 y.o. female complaining of rash to the hands for several months.  Also complaining of chronic nosebleeds.  No other significant symptoms.  HPI   Prior to Admission medications   Medication Sig Start Date End Date Taking? Authorizing Provider  ranitidine (ZANTAC) 150 MG tablet Take 150 mg by mouth 2 (two) times daily.   Yes [provider]    No Known Allergies  Patient Active Problem List   Diagnosis Date Noted  . Weight gain 07/14/2017  . Frequent nosebleeds 07/14/2017  . Chronic nonintractable headache 07/14/2017  . Chronic neck pain 07/14/2017    Past Medical History:  Diagnosis Date  . Asthma   . Gastritis     Past Surgical History:  Procedure Laterality Date  . CERVIX SURGERY      Social History   Socioeconomic History  . Marital status: Single    Spouse name: Not on file  . Number of children: Not on file  . Years of education: Not on file  . Highest education level: Not on file  Social Needs  . Financial resource strain: Not on file  . Food insecurity - worry: Not on file  . Food insecurity - inability: Not on file  . Transportation needs - medical: Not on file  . Transportation needs - non-medical: Not on file  Occupational History  . Not on file  Tobacco Use  . Smoking status: Never Smoker  . Smokeless tobacco: Never Used  Substance and Sexual Activity  . Alcohol use: No  . Drug use: Not on file  . Sexual activity: Not on file  Other Topics Concern  . Not on file  Social History Narrative  . Not on file    Family History  Problem Relation Age of Onset  . Diabetes Mother   . Hyperlipidemia Mother   . Cancer Sister   . Diabetes Sister   . Heart disease Sister   . Diabetes Maternal  Grandmother   . Diabetes Maternal Grandfather   . Diabetes Paternal Grandmother   . Diabetes Paternal Grandfather      Review of Systems  Constitutional: Negative for chills and fever.  HENT: Positive for nosebleeds.   Respiratory: Negative for cough and shortness of breath.   Cardiovascular: Negative for chest pain and palpitations.  Gastrointestinal: Negative for nausea and vomiting.  Skin: Positive for itching and rash.  Neurological: Negative for dizziness and headaches.  All other systems reviewed and are negative.   Vitals:   08/15/17 1624  BP: 102/60  Pulse: 80  Resp: 16  Temp: 97.9 F (36.6 C)  SpO2: 99%    Physical Exam  Constitutional: She is oriented to person, place, and time. She appears well-developed and well-nourished.  HENT:  Head: Normocephalic and atraumatic.  Eyes: Conjunctivae and EOM are normal. Pupils are equal, round, and reactive to light.  Neck: Normal range of motion. Neck supple.  Cardiovascular: Normal rate, regular rhythm and normal heart sounds.  Pulmonary/Chest: Effort normal and breath sounds normal.  Musculoskeletal: Normal range of motion.  Neurological: She is alert and oriented to person, place, and time. No sensory deficit. She exhibits normal muscle tone.  Skin: Capillary refill takes less than 2 seconds.  Positive  dermatitis to both hands with excoriations.  Psychiatric: She has a normal mood and affect. Her behavior is normal.  Vitals reviewed.    ASSESSMENT & PLAN: Patricia [name] was seen today for epistaxis and hand problem.  Diagnoses and all orders for this visit:  Contact dermatitis and eczema -     triamcinolone cream (KENALOG) 0.1 %; Apply 1 application topically 2 (two) times daily. -     Ambulatory referral to Dermatology  History of abnormal cervical Pap smear -     Ambulatory referral to Gynecology  Eczema of both hands -     triamcinolone cream (KENALOG) 0.1 %; Apply 1 application topically 2 (two) times  daily. -     Ambulatory referral to Dermatology    Patient Instructions       IF you received an x-ray today, you will receive an invoice from [facility]. Please contact [facility] at [phone] with questions or concerns regarding your invoice.   IF you received labwork today, you will receive an invoice from [facility]. Please contact [facility] at [phone] with questions or concerns regarding your invoice.   Our billing staff will not be able to assist you with questions regarding bills from these companies.  You will be contacted with the lab results as soon as they are available. The fastest way to get your results is to activate your My Chart account. Instructions are located on the last page of this paperwork. If you have not heard from [place] regarding the results in 2 weeks, please contact this office.     Contact Dermatitis Dermatitis is redness, soreness, and swelling (inflammation) of the skin. Contact dermatitis is a reaction to certain substances that touch the skin. You either touched something that irritated your skin, or you have allergies to something you touched. Follow these instructions at home: Skin Care  Moisturize your skin as needed.  Apply cool compresses to the affected areas.  Try taking a bath with: ? Epsom salts. Follow the instructions on the package. You can get these at a pharmacy or grocery store. ? Baking soda. Pour a small amount into the bath as told by your doctor. ? Colloidal oatmeal. Follow the instructions on the package. You can get this at a pharmacy or grocery store.  Try applying baking soda paste to your skin. Stir water into baking soda until it looks like paste.  Do not scratch your skin.  Bathe less often.  Bathe in lukewarm water. Avoid using hot water. Medicines  Take or apply over-the-counter and prescription medicines only as told by your doctor.  If you were prescribed an antibiotic medicine, take or  apply your antibiotic as told by your doctor. Do not stop taking the antibiotic even if your condition starts to get better. General instructions  Keep all follow-up visits as told by your doctor. This is important.  Avoid the substance that caused your reaction. If you do not know what caused it, keep a journal to try to track what caused it. Write down: ? What you eat. ? What cosmetic products you use. ? What you drink. ? What you wear in the affected area. This includes jewelry.  If you were given a bandage (dressing), take care of it as told by your doctor. This includes when to change and remove it. Contact a doctor if:  You do not get better with treatment.  Your condition gets worse.  You have signs of infection such as: ? Swelling. ? Tenderness. ? Redness. ?  Soreness. ? Warmth.  You have a fever.  You have new symptoms. Get help right away if:  You have a very bad headache.  You have neck pain.  Your neck is stiff.  You throw up (vomit).  You feel very sleepy.  You see red streaks coming from the affected area.  Your bone or joint underneath the affected area becomes painful after the skin has healed.  The affected area turns darker.  You have trouble breathing. This information is not intended to replace advice given to you by your health care provider. Make sure you discuss any questions you have with your health care provider. Document Released: 04/28/2009 Document Revised: 12/07/2015 Document Reviewed: 11/16/2014 Elsevier Interactive Patient Education  2018 [employer].     Patient Instructions       IF you received an x-ray today, you will receive an invoice from [facility]. Please contact [facility] at [phone] with questions or concerns regarding your invoice.   IF you received labwork today, you will receive an invoice from [facility]. Please contact [facility] at [phone] with questions or concerns regarding your  invoice.   Our billing staff will not be able to assist you with questions regarding bills from these companies.  You will be contacted with the lab results as soon as they are available. The fastest way to get your results is to activate your My Chart account. Instructions are located on the last page of this paperwork. If you have not heard from [place] regarding the results in 2 weeks, please contact this office.     Contact Dermatitis Dermatitis is redness, soreness, and swelling (inflammation) of the skin. Contact dermatitis is a reaction to certain substances that touch the skin. You either touched something that irritated your skin, or you have allergies to something you touched. Follow these instructions at home: Skin Care  Moisturize your skin as needed.  Apply cool compresses to the affected areas.  Try taking a bath with: ? Epsom salts. Follow the instructions on the package. You can get these at a pharmacy or grocery store. ? Baking soda. Pour a small amount into the bath as told by your doctor. ? Colloidal oatmeal. Follow the instructions on the package. You can get this at a pharmacy or grocery store.  Try applying baking soda paste to your skin. Stir water into baking soda until it looks like paste.  Do not scratch your skin.  Bathe less often.  Bathe in lukewarm water. Avoid using hot water. Medicines  Take or apply over-the-counter and prescription medicines only as told by your doctor.  If you were prescribed an antibiotic medicine, take or apply your antibiotic as told by your doctor. Do not stop taking the antibiotic even if your condition starts to get better. General instructions  Keep all follow-up visits as told by your doctor. This is important.  Avoid the substance that caused your reaction. If you do not know what caused it, keep a journal to try to track what caused it. Write down: ? What you eat. ? What cosmetic products you use. ? What you  drink. ? What you wear in the affected area. This includes jewelry.  If you were given a bandage (dressing), take care of it as told by your doctor. This includes when to change and remove it. Contact a doctor if:  You do not get better with treatment.  Your condition gets worse.  You have signs of infection such as: ? Swelling. ?  Tenderness. ? Redness. ? Soreness. ? Warmth.  You have a fever.  You have new symptoms. Get help right away if:  You have a very bad headache.  You have neck pain.  Your neck is stiff.  You throw up (vomit).  You feel very sleepy.  You see red streaks coming from the affected area.  Your bone or joint underneath the affected area becomes painful after the skin has healed.  The affected area turns darker.  You have trouble breathing. This information is not intended to replace advice given to you by your health care provider. Make sure you discuss any questions you have with your health care provider. Document Released: 04/28/2009 Document Revised: 12/07/2015 Document Reviewed: 11/16/2014 Elsevier Interactive Patient Education  2018 Elsevier Inc.      Edwina Barth, MD [facility]

## 2017-08-15 NOTE — Patient Instructions (Addendum)
     IF you received an x-ray today, you will receive an invoice from [facility]. Please contact [facility] at [phone] with questions or concerns regarding your invoice.   IF you received labwork today, you will receive an invoice from [facility]. Please contact [facility] at [phone] with questions or concerns regarding your invoice.   Our billing staff will not be able to assist you with questions regarding bills from these companies.  You will be contacted with the lab results as soon as they are available. The fastest way to get your results is to activate your My Chart account. Instructions are located on the last page of this paperwork. If you have not heard from us regarding the results in 2 weeks, please contact this office.     Contact Dermatitis Dermatitis is redness, soreness, and swelling (inflammation) of the skin. Contact dermatitis is a reaction to certain substances that touch the skin. You either touched something that irritated your skin, or you have allergies to something you touched. Follow these instructions at home: Skin Care  Moisturize your skin as needed.  Apply cool compresses to the affected areas.  Try taking a bath with: ? Epsom salts. Follow the instructions on the package. You can get these at a pharmacy or grocery store. ? Baking soda. Pour a small amount into the bath as told by your doctor. ? Colloidal oatmeal. Follow the instructions on the package. You can get this at a pharmacy or grocery store.  Try applying baking soda paste to your skin. Stir water into baking soda until it looks like paste.  Do not scratch your skin.  Bathe less often.  Bathe in lukewarm water. Avoid using hot water. Medicines  Take or apply over-the-counter and prescription medicines only as told by your doctor.  If you were prescribed an antibiotic medicine, take or apply your antibiotic as told by your doctor. Do not stop taking the antibiotic  even if your condition starts to get better. General instructions  Keep all follow-up visits as told by your doctor. This is important.  Avoid the substance that caused your reaction. If you do not know what caused it, keep a journal to try to track what caused it. Write down: ? What you eat. ? What cosmetic products you use. ? What you drink. ? What you wear in the affected area. This includes jewelry.  If you were given a bandage (dressing), take care of it as told by your doctor. This includes when to change and remove it. Contact a doctor if:  You do not get better with treatment.  Your condition gets worse.  You have signs of infection such as: ? Swelling. ? Tenderness. ? Redness. ? Soreness. ? Warmth.  You have a fever.  You have new symptoms. Get help right away if:  You have a very bad headache.  You have neck pain.  Your neck is stiff.  You throw up (vomit).  You feel very sleepy.  You see red streaks coming from the affected area.  Your bone or joint underneath the affected area becomes painful after the skin has healed.  The affected area turns darker.  You have trouble breathing. This information is not intended to replace advice given to you by your health care provider. Make sure you discuss any questions you have with your health care provider. Document Released: 04/28/2009 Document Revised: 12/07/2015 Document Reviewed: 11/16/2014 Elsevier Interactive Patient Education  2018 Elsevier Inc.  

## 2017-08-21 ENCOUNTER — Ambulatory Visit: Payer: BLUE CROSS/BLUE SHIELD | Admitting: Family Medicine

## 2017-08-29 ENCOUNTER — Telehealth: Payer: Self-pay | Admitting: Emergency Medicine

## 2017-08-29 DIAGNOSIS — L309 Dermatitis, unspecified: Secondary | ICD-10-CM

## 2017-08-29 DIAGNOSIS — L259 Unspecified contact dermatitis, unspecified cause: Secondary | ICD-10-CM

## 2017-08-29 MED ORDER — TRIAMCINOLONE ACETONIDE 0.1 % EX CREA: 1.0000 "application " | TOPICAL_CREAM | Freq: Two times a day (BID) | CUTANEOUS | 2 refills | Status: DC

## 2017-08-29 NOTE — Telephone Encounter (Signed)
Copied from CRM [id number]. Topic: Quick Communication - Rx Refill/Question >> Aug 29, 2017  8:01 AM Patricia [name], Patricia [name] wrote: Medication: triamcinolone cream (KENALOG) 0.1 %   Has the patient contacted their pharmacy? No   (Agent: If no, request that the patient contact the pharmacy for the refill.)   Preferred Pharmacy (with phone number or street name): [facility] [phone] - [place],  - [address] [facility] OF [address]   Agent: Please be advised that RX refills may take up to 3 business days. We ask that you follow-up with your pharmacy.

## 2017-08-29 NOTE — Telephone Encounter (Signed)
Kenalog -triamcinolone cream refill Last OV: 08/15/17 Last Refill:08/15/17 Pharmacy:[facility]

## 2017-09-03 ENCOUNTER — Ambulatory Visit: Payer: BLUE CROSS/BLUE SHIELD | Admitting: Neurology

## 2017-10-10 ENCOUNTER — Encounter: Payer: Self-pay | Admitting: Obstetrics & Gynecology

## 2017-10-10 ENCOUNTER — Ambulatory Visit (INDEPENDENT_AMBULATORY_CARE_PROVIDER_SITE_OTHER): Payer: BLUE CROSS/BLUE SHIELD | Admitting: Obstetrics & Gynecology

## 2017-10-10 VITALS — BP 128/84 | Ht 59.75 in | Wt 189.0 lb

## 2017-10-10 DIAGNOSIS — Z1151 Encounter for screening for human papillomavirus (HPV): Secondary | ICD-10-CM | POA: Diagnosis not present

## 2017-10-10 DIAGNOSIS — Z87410 Personal history of cervical dysplasia: Secondary | ICD-10-CM | POA: Diagnosis not present

## 2017-10-10 DIAGNOSIS — Z01419 Encounter for gynecological examination (general) (routine) without abnormal findings: Secondary | ICD-10-CM

## 2017-10-10 DIAGNOSIS — Z3009 Encounter for other general counseling and advice on contraception: Secondary | ICD-10-CM | POA: Diagnosis not present

## 2017-10-10 DIAGNOSIS — N898 Other specified noninflammatory disorders of vagina: Secondary | ICD-10-CM

## 2017-10-10 DIAGNOSIS — Z113 Encounter for screening for infections with a predominantly sexual mode of transmission: Secondary | ICD-10-CM | POA: Diagnosis not present

## 2017-10-10 MED ORDER — FLUCONAZOLE 150 MG PO TABS: 150.0000 mg | ORAL_TABLET | Freq: Every day | ORAL | 1 refills | Status: AC

## 2017-10-10 NOTE — Progress Notes (Signed)
Patricia [name] 09/25/1985 [id number]   History:    32 y.o. G1P1L1 From [place]. Engaged.  Son is 785 yo.  RP:  New patient presenting for annual gyn exam   HPI: Menses regular with normal flow every month.  Using condoms and natural rhythm method of contraception.  No pelvic pain.  C/O increased vaginal discharge, yellow to green with odor.  Would like STD screen.  H/O Cryotherapy of Cervix.  Urine/BMs wnl.  Breasts wnl.  BMI 37.22.  Sister with Ovarian Tumor, will obtain patho information.  Past medical history,surgical history, family history and social history were all reviewed and documented in the EPIC chart.  Gynecologic History Patient's last menstrual period was 09/17/2017. Contraception: condoms and rhythm method Last Pap: None since Cryotherapy in 2015. Last mammogram: Never Bone Density: Never Colonoscopy: Never  Obstetric History OB History  Gravida Para Term Preterm AB Living  1 1       1   SAB TAB Ectopic Multiple Live Births               # Outcome Date GA Lbr Len/2nd Weight Sex Delivery Anes PTL Lv  1 Para              ROS: A ROS was performed and pertinent positives and negatives are included in the history.  GENERAL: No fevers or chills. HEENT: No change in vision, no earache, sore throat or sinus congestion. NECK: No pain or stiffness. CARDIOVASCULAR: No chest pain or pressure. No palpitations. PULMONARY: No shortness of breath, cough or wheeze. GASTROINTESTINAL: No abdominal pain, nausea, vomiting or diarrhea, melena or bright red blood per rectum. GENITOURINARY: No urinary frequency, urgency, hesitancy or dysuria. MUSCULOSKELETAL: No joint or muscle pain, no back pain, no recent trauma. DERMATOLOGIC: No rash, no itching, no lesions. ENDOCRINE: No polyuria, polydipsia, no heat or cold intolerance. No recent change in weight. HEMATOLOGICAL: No anemia or easy bruising or bleeding. NEUROLOGIC: No headache, seizures, numbness, tingling or weakness. PSYCHIATRIC: No  depression, no loss of interest in normal activity or change in sleep pattern.     Exam:   BP 128/84   Ht 4' 11.75" (1.518 m)   Wt 189 lb (85.7 kg)   LMP 09/17/2017 Comment: no birth control   BMI 37.22 kg/m   Body mass index is 37.22 kg/m.  General appearance : Well developed well nourished female. No acute distress HEENT: Eyes: no retinal hemorrhage or exudates,  Neck supple, trachea midline, no carotid bruits, no thyroidmegaly Lungs: Clear to auscultation, no rhonchi or wheezes, or rib retractions  Heart: Regular rate and rhythm, no murmurs or gallops Breast:Examined in sitting and supine position were symmetrical in appearance, no palpable masses or tenderness,  no skin retraction, no nipple inversion, no nipple discharge, no skin discoloration, no axillary or supraclavicular lymphadenopathy Abdomen: no palpable masses or tenderness, no rebound or guarding Extremities: no edema or skin discoloration or tenderness  Pelvic: Vulva: Normal             Vagina: No gross lesions.  Increased discharge c/w yeasts.  Wet prep done.  Cervix: No gross lesions or discharge.  Pap/HR HPV done.  Gono-Chlam done.  Uterus  AV, normal size, shape and consistency, non-tender and mobile  Adnexa  Without masses or tenderness  Anus: Normal   Assessment/Plan:  32 y.o. female for annual exam   1. Encounter for routine gynecological examination with Papanicolaou smear of cervix Normal gynecologic exam.  Pap test with high risk HPV done.  Breast exam normal.  Regular aerobic and weightlifting physical activity with low calorie/carb diet recommended.  2. Encounter for other general counseling or advice on contraception Counseling on contraception.  Patient prefers to continue with natural rhythm method and condoms.  3. History of cervical dysplasia Cryotherapy of cervix done in 2015.  No Pap test done since then.  Pap with high risk HPV done today.  4. Screen for STD (sexually transmitted  disease) Condoms recommended.  Received hepatitis B vaccine and immunity documented at work. - HIV antibody (with reflex) - RPR - Hepatitis C Antibody - Gono-Chlam on pap  5. Vaginal discharge Candida vaginitis confirmed by wet prep.  Diagnosis and treatment discussed.  Will treat with fluconazole 1 tablet per mouth daily for 3 days.  Will use probiotic after treatment. - WET PREP FOR TRICH, YEAST, CLUE  Counseling on above issues and coordination of care more than 50% for 10 minutes.  Genia Del MD, 4:23 PM 10/10/2017

## 2017-10-10 NOTE — Patient Instructions (Signed)
1. Encounter for routine gynecological examination with Papanicolaou smear of cervix Normal gynecologic exam.  Pap test with high risk HPV done.  Breast exam normal.  Regular aerobic and weightlifting physical activity with low calorie/carb diet recommended.  2. Encounter for other general counseling or advice on contraception Counseling on contraception.  Patient prefers to continue with natural rhythm method and condoms.  3. History of cervical dysplasia Cryotherapy of cervix done in 2015.  No Pap test done since then.  Pap with high risk HPV done today.  4. Screen for STD (sexually transmitted disease) Condoms recommended.  Received hepatitis B vaccine and immunity documented at work. - HIV antibody (with reflex) - RPR - Hepatitis C Antibody - Gono-Chlam on pap  5. Vaginal discharge Candida vaginitis confirmed by wet prep.  Diagnosis and treatment discussed.  Will treat with fluconazole 1 tablet per mouth daily for 3 days.  Will use probiotic after treatment. - WET PREP FOR [place], YEAST, CLUE  Karsten Ro, fue un placer conocerle hoy!  Voy a informarle de sus [employer].   Candidiasis vaginal en los adultos (Gastrointestinal Yeast Infection, Adult) La candidiasis vaginal es una afeccin que causa dolor, hinchazn y enrojecimiento (inflamacin) de la vagina. Tambin causa secrecin vaginal. Esta es una enfermedad frecuente. Algunas mujeres contraen esta infeccin con frecuencia. CAUSAS La causa de la infeccin es un cambio en el equilibrio normal de los hongos (cndida) y las bacterias que viven en la vagina. Esta alteracin deriva en el crecimiento excesivo de los hongos, lo que causa la inflamacin. FACTORES DE RIESGO Es ms probable que esta afeccin se manifieste en:  Las mujeres que toman antibiticos.  Las mujeres que tienen diabetes.  Las mujeres que toman anticonceptivos.  Las mujeres que estn embarazadas.  Las mujeres que se hacen duchas vaginales con  frecuencia.  Las mujeres que tienen un sistema de defensa (inmunitario) dbil.  Las mujeres que han tomado corticoides durante mucho tiempo.  Las mujeres que usan ropa ajustada con frecuencia. SNTOMAS Los sntomas de esta afeccin incluyen lo siguiente:  Secrecin vaginal blanca y espesa.  Hinchazn, picazn, enrojecimiento e irritacin de la vagina. Los labios de la vagina (vulva) tambin se pueden infectar.  Dolor o ardor al Garment/textile technologist.  [place]. DIAGNSTICO Esta afeccin se diagnostica mediante la historia clnica y un examen fsico. Este incluye un examen plvico. El mdico examinar una muestra de la secrecin vaginal con un microscopio. Probablemente el mdico enve esta muestra al laboratorio para analizarla y confirmar el diagnstico. TRATAMIENTO Esta afeccin se trata con medicamentos. Los [place] pueden ser recetados o de venta libre. Podrn indicarle que use uno o ms de lo siguiente:  Medicamentos por va oral.  Medicamentos que se aplican como una crema.  Medicamentos que se colocan directamente en la vagina (vulos vaginales). [place] o aplquese los medicamentos de venta libre y Editor, commissioning como se lo haya indicado el mdico.  No tenga relaciones sexuales hasta que el mdico lo autorice. Comunique a su compaero sexual que tiene una infeccin por hongos. Esas personas deben consultar al mdico si tienen sntomas.  No use ropa ajustada, como pantis o pantalones ajustados.  Evite el uso de tampones hasta que el mdico lo autorice.  Consuma ms yogur. Esto puede ayudar a Technical brewer de la candidiasis.  Intente darse un bao de asiento para [employer]. Se trata de un bao de agua tibia que se toma mientras se est  sentado. El agua solo debe Systems analyst las caderas y cubrir las nalgas. Hgalo 3o 4veces al da o como se lo haya indicado el mdico.  No se haga  duchas vaginales.  Use ropa interior transpirable de algodn.  Si tiene diabetes, mantenga bajo control los niveles de Dispensing optician. SOLICITE ATENCIN MDICA SI:  Jaclynn Guarneri.  Los sntomas desaparecen y Teacher, adult education.  Los sntomas no mejoran con Dispensing optician.  Los sntomas empeoran.  Aparecen nuevos sntomas.  Aparecen ampollas alrededor o adentro de la vagina.  Le sale sangre de la vagina y no est menstruando.  Siente dolor en el abdomen. Esta informacin no tiene Marine scientist el consejo del mdico. Asegrese de hacerle al mdico cualquier pregunta que tenga. Document Released: 04/10/2005 Document Revised: 10/23/2015 Document Reviewed: 01/02/2015 Elsevier Interactive Patient Education  2018 [place] (Health Maintenance, Female) Un estilo de vida saludable y los cuidados preventivos pueden favorecer considerablemente a la salud y Musician. Pregunte a su mdico cul es el cronograma de exmenes peridicos apropiado para usted. Esta es una buena oportunidad para consultarlo sobre cmo prevenir enfermedades y [place] sano. Adems de los controles, hay muchas otras cosas que puede hacer usted mismo. Los expertos han realizado numerosas investigaciones [employer] cambios en el estilo de vida y las medidas de prevencin que, [place], lo ayudarn a mantenerse sano. Solicite a su mdico ms informacin. EL PESO Y LA DIETA Consuma una dieta saludable.  Asegrese de [employer] verduras, frutas, productos lcteos de bajo contenido de [place] y Advertising account planner.  No consuma muchos alimentos de alto contenido de grasas slidas, azcares agregados o sal.  Realice actividad fsica con regularidad. Esta es una de las prcticas ms importantes que puede hacer por su salud. ? La Delorise Shiner de los adultos deben hacer ejercicio durante al menos 16mnutos por semana. El ejercicio debe aumentar la frecuencia cardaca y  pActorla transpiracin (ejercicio de i[place]. ? La mayora de los adultos tambin deben hacer ejercicios de elongacin al mToysRusveces a la semana. Agregue esto al su plan de ejercicio de intensidad moderada. Mantenga un peso saludable.  El ndice de masa corporal ([facility] es una medida que puede utilizarse para identificar posibles problemas de pNorco Proporciona una estimacin de la grasa corporal basndose en el peso y la altura. Su mdico puede ayudarle a dRadiation protection practitionerIHillandaley a lScientist, forensico mTheatre managerun peso saludable.  Para las mujeres de 20aos o ms: ? Un IBaylor Emergency Medical Centermenor de 18,5 se considera bajo peso. ? Un ISpecialists Surgery Center Of Del Mar LLCentre 18,5 y 24,9 es normal. ? Un ISummit Ambulatory Surgery Centerentre 25 y 29,9 se considera sobrepeso. ? Un IMC de 30 o ms se considera obesidad. Observe los niveles de colesterol y lpidos en la sangre.  Debe comenzar a rEnglish as a second language teacherde lpidos y cResearch officer, trade unionen la sangre a los 20aos y luego repetirlos cada 563aos  Es posible que nAutomotive engineerlos niveles de colesterol con mayor frecuencia si: ? Sus niveles de lpidos y colesterol son altos. ? Es mayor de 545OPF ? Presenta un alto riesgo de padecer enfermedades cardacas. DETECCIN DE CNCER Cncer de pulmn  Se recomienda realizar exmenes de deteccin de cncer de pulmn a personas adultas entre 577y 873aos que estn en riesgo de dHorticulturist, commercialde pulmn por sus antecedentes de consumo de tabaco.  Se recomienda una tomografa computarizada de baja dosis de los pulmones todos los aos a las personas que: ? Fuman actualmente. ?  Hayan dejado el hbito en algn momento en los ltimos 15aos. ? Hayan fumado durante 30aos un paquete diario. Un paquete-ao equivale a fumar un promedio de un paquete de cigarrillos diario durante un ao.  Los exmenes de deteccin anuales deben continuar hasta que hayan pasado 15aos desde que dej de fumar.  Ya no debern realizarse si tiene un problema de salud que le impida recibir  tratamiento para Science writer de pulmn. Cncer de mama  Practique la autoconciencia de la mama. Esto significa reconocer la apariencia normal de sus mamas y cmo las siente.  Tambin significa realizar autoexmenes regulares de [employer]. Informe a su mdico sobre cualquier cambio, sin importar cun pequeo sea.  Si tiene entre 20 y 58 aos, un mdico debe realizarle un examen clnico de las mamas como parte del examen regular de [place], cada 1 a 3aos.  Si tiene 40aos o ms, debe Information systems manager clnico de las Microsoft. Tambin considere realizarse una [place] todos los [place].  Si tiene antecedentes familiares de cncer de mama, hable con su mdico para someterse a un estudio gentico.  Si tiene alto riesgo de Chief Financial Officer de mama, hable con su mdico para someterse a Public house manager y [employer].  La evaluacin del gen del cncer de mama (BRCA) se recomienda a mujeres que tengan familiares con cnceres relacionados con el BRCA. Los cnceres relacionados con el BRCA incluyen los siguientes: ? [place]. ? Ovario. ? Trompas. ? Cnceres de peritoneo.  Los resultados de la evaluacin determinarn la necesidad de asesoramiento gentico y de [place] de BRCA1 y BRCA2. Cncer de cuello del tero El mdico puede recomendarle que se haga pruebas peridicas de deteccin de cncer de los rganos de la pelvis (ovarios, tero y vagina). Estas pruebas incluyen un examen plvico, que abarca controlar si se produjeron cambios microscpicos en la superficie del cuello del tero (prueba de Papanicolaou). Pueden recomendarle que se haga estas pruebas cada 3aos, a partir de los 21aos.  A las mujeres que tienen entre 30 y 6aos, los mdicos pueden recomendarles que se sometan a exmenes plvicos y pruebas de Papanicolaou cada 71aos, o a la prueba de Papanicolaou y el examen plvico en combinacin con estudios de deteccin del virus  del papiloma humano (VPH) cada 5aos. Algunos tipos de VPH aumentan el riesgo de Chief Financial Officer de cuello del tero. La prueba para la deteccin del VPH tambin puede realizarse a mujeres de cualquier edad cuyos resultados de la prueba de Papanicolaou no sean claros.  Es posible que otros mdicos no recomienden exmenes de deteccin a mujeres no embarazadas que se consideran sujetos de bajo riesgo de Chief Financial Officer de pelvis y que no tienen sntomas. Pregntele al mdico si un examen plvico de deteccin es adecuado para usted.  Si ha recibido un tratamiento para Science writer cervical o una enfermedad que podra causar cncer, necesitar realizarse una prueba de Papanicolaou y controles durante al menos 15 aos de concluido el [place]. Si no se ha hecho el Papanicolaou con regularidad, debern volver a evaluarse los factores de riesgo (como tener un nuevo compaero sexual), para Teacher, adult education si debe realizarse los estudios nuevamente. Algunas mujeres sufren problemas mdicos que aumentan la probabilidad de Museum/gallery curator cncer de cuello del tero. En estos casos, el mdico podr [employer]COMM se realicen controles y pruebas de Papanicolaou con ms frecuencia. Cncer colorrectal  Este tipo de cncer puede detectarse y a menudo prevenirse.  Por lo general,  los estudios de rutina se deben Medical laboratory scientific officer a Field seismologist a Proofreader de los 57 aos y Severy 79 aos.  Sin embargo, el mdico podr aconsejarle que lo haga antes, si tiene factores de riesgo para el cncer de colon.  Tambin puede recomendarle que use un kit de prueba para Hydrologist en la materia fecal.  Es posible que se use una pequea cmara en el extremo de un tubo para examinar directamente el colon (sigmoidoscopia o colonoscopia) a fin de Hydrographic surveyor formas tempranas de cncer colorrectal.  Los exmenes de rutina generalmente comienzan a los 47aos.  El examen directo del colon se debe repetir cada 5 a 10aos hasta los 75aos. Sin embargo, es  posible que se realicen exmenes con mayor frecuencia, si se detectan formas tempranas de plipos precancerosos o pequeos bultos. Cncer de piel  Revise la piel de la cabeza a los pies con regularidad.  Informe a su mdico si aparecen nuevos lunares o los que tiene se modifican, especialmente en su forma y color.  Tambin notifique al mdico si tiene un lunar que es ms grande que el tamao de una goma de lpiz.  Siempre use pantalla solar. Aplique pantalla solar de [place] D[place] y repetida a lo largo del Training and development officer.  Protjase usando mangas y [employer], un sombrero de ala ancha y gafas para el sol, siempre que se encuentre en el exterior. ENFERMEDADES CARDACAS, DIABETES E HIPERTENSIN ARTERIAL  La hipertensin arterial causa enfermedades cardacas y [place] el riesgo de ictus. La hipertensin arterial es ms probable en los siguientes casos: ? Las personas que tienen la presin arterial en el extremo del rango normal (100-139/85-89 mm Hg). ? [employer] con sobrepeso u obesidad. ? Scientist, water quality.  Si usted tiene entre 18 y 39 aos, debe medirse la presin arterial cada 3 a 5 aos. Si usted tiene 40 aos o ms, debe medirse la presin arterial [employer]. Debe medirse la presin arterial dos veces: una vez cuando est en un hospital o una clnica y la otra vez cuando est en otro sitio. Registre el promedio de [employer]. Para controlar su presin arterial cuando no est en un hospital o [place], puede usar lo siguiente: ? Jorje Guild automtica para medir la presin arterial en una farmacia. ? Un monitor para medir la presin arterial en el hogar.  Si tiene entre 34 y 50 aos, consulte a su mdico si debe tomar aspirina para prevenir el ictus.  Realcese exmenes de deteccin de la diabetes con regularidad. Esto incluye la toma de [place] de sangre para controlar el nivel de azcar en la sangre durante el [place]. ? Si tiene un peso normal y un bajo riesgo  de padecer diabetes, realcese este anlisis cada tres aos despus de los 45aos. ? Si tiene sobrepeso y un alto riesgo de padecer diabetes, considere someterse a este anlisis antes o con mayor frecuencia. PREVENCIN DE INFECCIONES HepatitisB  Si tiene un riesgo ms alto de Museum/gallery curator hepatitis B, debe someterse a un examen de deteccin de este virus. Se considera que tiene un alto riesgo de contraer hepatitis B si: ? Naci en un pas donde la hepatitis B es frecuente. Pregntele a su mdico qu pases son considerados de Public affairs consultant. ? Sus padres nacieron en un pas de alto riesgo y usted no recibi una vacuna que lo proteja contra la hepatitis B (vacuna contra la hepatitis B). ? [place]. ? [place] agujas para inyectarse drogas. ? Vive  con alguien que tiene hepatitis B. ? Ha tenido sexo con alguien que tiene hepatitis B. ? Recibe tratamiento de hemodilisis. ? Toma ciertos medicamentos para el cncer, trasplante de rganos y afecciones autoinmunitarias. Hepatitis C  Se recomienda un anlisis de [place] para: ? [employer] 1945 y 1965. ? Todas las personas que tengan un riesgo de haber contrado hepatitis C. Enfermedades de transmisin sexual (ETS).  Debe realizarse pruebas de deteccin de enfermedades de transmisin sexual (ETS), incluidas gonorrea y clamidia si: ? Es sexualmente activo y es menor de 66YQI. ? Es mayor de 24aos, y Investment banker, operational informa que corre riesgo de tener este tipo de infecciones. ? La actividad sexual ha cambiado desde que le hicieron la ltima prueba de deteccin y tiene un riesgo mayor de Best boy clamidia o Radio broadcast assistant. Pregntele al mdico si usted tiene riesgo.  Si no tiene el VIH, pero corre riesgo de infectarse por el virus, se recomienda tomar diariamente un medicamento recetado para evitar la infeccin. Esto se conoce como profilaxis previa a la exposicin. Se considera que est en riesgo si: ? Es [place] sexualmente y no [place]  preservativos habitualmente o no conoce el estado del VIH de sus Advertising copywriter. ? Se inyecta drogas. ? Es [place] sexualmente con Ardelia Mems pareja que tiene VIH. Consulte a su mdico para saber si tiene un alto riesgo de infectarse por el VIH. Si opta por comenzar la profilaxis previa a la exposicin, primero debe realizarse anlisis de deteccin del VIH. Luego, le harn anlisis cada 42mses mientras est tomando los medicamentos para la profilaxis previa a la exposicin. [facility] Si es premenopusica y puede quedar eKing [place] solicite a su mdico asesoramiento previo a la concepcin.  Si puede quedar embarazada, tome 400 a [id number]([id number]) de cido fAnheuser-Busch  Si desea evitar el embarazo, hable con su mdico sobre el control de la natalidad (anticoncepcin). OSTEOPOROSIS Y MENOPAUSIA  La osteoporosis es una enfermedad en la que los huesos pierden los minerales y la fuerza por el avance de la edad. El resultado pueden ser fracturas graves en los hHoboken El riesgo de osteoporosis puede identificarse con uArdelia Memsprueba de densidad sea.  Si tiene 65aos o ms, o si est en riesgo de sufrir osteoporosis y fracturas, pregunte a su mdico si debe someterse a exmenes.  Consulte a su mdico si debe tomar un suplemento de calcio o de vitamina D para reducir el riesgo de osteoporosis.  La menopausia puede presentar ciertos sntomas fsicos y rGaffer  La terapia de reemplazo hormonal puede reducir algunos de estos sntomas y rGaffer Consulte a su mdico para saber si la terapia de reemplazo hormonal es conveniente para usted. INSTRUCCIONES PARA EL CUIDADO EN EL HOGAR  Realcese los estudios de rutina de la salud, dentales y de lPublic librarian  [place]  No consuma ningn producto que contenga tabaco, lo que incluye cigarrillos, tabaco de mHigher education careers advisero cPsychologist, sport and exercise  Si est embarazada, no beba alcohol.  Si est amamantando, reduzca el consumo de  alcohol y la frecuencia con la que consume.  Si es mujer y no est embarazada limite el consumo de alcohol a no ms de 1 medida por da. Una medida equivale a 12onzas de cerveza, 5onzas de vino o 1onzas de bebidas alcohlicas de alta graduacin.  No consuma drogas.  No comparta agujas.  Solicite ayuda a su mdico si necesita apoyo o informacin para abandonar las drogas.  Informe a su mdico si a  menudo se siente deprimido.  Notifique a su mdico si alguna vez ha sido vctima de abuso o si no se siente seguro en su hogar. Esta informacin no tiene Marine scientist el consejo del mdico. Asegrese de hacerle al mdico cualquier pregunta que tenga. Document Released: 06/20/2011 Document Revised: 07/22/2014 Document Reviewed: 04/04/2015 Elsevier Interactive Patient Education  [employer].

## 2017-10-13 LAB — WET PREP FOR TRICH, YEAST, CLUE

## 2017-10-13 LAB — HEPATITIS C ANTIBODY
HEP C AB: NONREACTIVE
SIGNAL TO CUT-OFF: 0.02 (ref <1.00)

## 2017-10-13 LAB — RPR: RPR Ser Ql: NONREACTIVE

## 2017-10-13 LAB — HIV ANTIBODY (ROUTINE TESTING W REFLEX): HIV: NONREACTIVE

## 2017-10-14 LAB — PAP IG, CT-NG NAA, HPV HIGH-RISK
C. trachomatis RNA, TMA: NOT DETECTED
HPV DNA HIGH RISK: NOT DETECTED
N. GONORRHOEAE RNA, TMA: NOT DETECTED

## 2017-12-24 ENCOUNTER — Ambulatory Visit (HOSPITAL_COMMUNITY)
Admission: EM | Admit: 2017-12-24 | Discharge: 2017-12-24 | Disposition: A | Discharge status: Home / Self Care | Payer: BLUE CROSS/BLUE SHIELD | Attending: Family Medicine | Admitting: Family Medicine

## 2017-12-24 ENCOUNTER — Encounter (HOSPITAL_COMMUNITY): Payer: Self-pay | Admitting: Emergency Medicine

## 2017-12-24 DIAGNOSIS — N3090 Cystitis, unspecified without hematuria: Secondary | ICD-10-CM | POA: Diagnosis not present

## 2017-12-24 DIAGNOSIS — R300 Dysuria: Secondary | ICD-10-CM | POA: Diagnosis present

## 2017-12-24 LAB — POCT URINALYSIS DIP (DEVICE)
Bilirubin Urine: NEGATIVE
GLUCOSE, UA: NEGATIVE mg/dL
KETONES UR: NEGATIVE mg/dL
NITRITE: NEGATIVE
PH: 7.5 (ref 5.0–8.0)
PROTEIN: 100 mg/dL — AB
Specific Gravity, Urine: 1.02 (ref 1.005–1.030)
Urobilinogen, UA: 1 mg/dL (ref 0.0–1.0)

## 2017-12-24 MED ORDER — PHENAZOPYRIDINE HCL 200 MG PO TABS: 200.0000 mg | ORAL_TABLET | Freq: Three times a day (TID) | ORAL | 0 refills | Status: DC

## 2017-12-24 MED ORDER — CEPHALEXIN 500 MG PO CAPS: 500.0000 mg | ORAL_CAPSULE | Freq: Two times a day (BID) | ORAL | 0 refills | Status: DC

## 2017-12-24 NOTE — ED Notes (Signed)
Unable to locate

## 2017-12-24 NOTE — ED Triage Notes (Signed)
Pt here for dysuria  

## 2017-12-24 NOTE — ED Provider Notes (Signed)
[facility]    ASSESSMENT & PLAN:  1. Cystitis     Meds ordered this encounter  Medications  . cephALEXin (KEFLEX) 500 MG capsule    Sig: Take 1 capsule (500 mg total) by mouth 2 (two) times daily.    Dispense:  10 capsule    Refill:  0  . phenazopyridine (PYRIDIUM) 200 MG tablet    Sig: Take 1 tablet (200 mg total) by mouth 3 (three) times daily.    Dispense:  6 tablet    Refill:  0   Urine culture sent. Will notify patient when results available. Will follow up with her PCP or here if not showing improvement over the next 48 hours, sooner if needed.  Outlined signs and symptoms indicating need for more acute intervention. Patient verbalized understanding. After Visit Summary given.  SUBJECTIVE:  Patricia [name] is a 32 y.o. female who complains of urinary frequency, urgency and dysuria for the past few days. No flank pain, fever, chills, abnormal vaginal discharge or bleeding. Hematuria: not present. Normal PO intake. No abdominal pain. No self treatment. Ambulatory without difficulty. No specific aggravating or alleviating factors reported.  ROS: As in HPI.  OBJECTIVE:  Vitals:   12/24/17 1630  BP: 117/73  Pulse: 78  Resp: 18  Temp: 98.4 F (36.9 C)  TempSrc: Oral  SpO2: 100%   Appears well, in no apparent distress. Abdomen is soft without tenderness, guarding, mass, rebound or organomegaly. No CVA tenderness.  Labs Reviewed  POCT URINALYSIS DIP (DEVICE) - Abnormal; Notable for the following components:      Result Value   Hgb urine dipstick LARGE (*)    Protein, ur 100 (*)    Leukocytes, UA SMALL (*)    All other components within normal limits  URINE CULTURE    No Known Allergies  Past Medical History:  Diagnosis Date  . Asthma   . Gastritis    Social History   Socioeconomic History  . Marital status: Single    Spouse name: Not on file  . Number of children: Not on file  . Years of education: Not on file  . Highest education  level: Not on file  Occupational History  . Not on file  Social Needs  . Financial resource strain: Not on file  . Food insecurity:    Worry: Not on file    Inability: Not on file  . Transportation needs:    Medical: Not on file    Non-medical: Not on file  Tobacco Use  . Smoking status: Never Smoker  . Smokeless tobacco: Never Used  Substance and Sexual Activity  . Alcohol use: No  . Drug use: Never  . Sexual activity: Yes    Partners: Male    Comment: 1st intercourse- 75, partners- 3, current partner- 1.5  yrs   Lifestyle  . Physical activity:    Days per week: Not on file    Minutes per session: Not on file  . Stress: Not on file  Relationships  . Social connections:    Talks on phone: Not on file    Gets together: Not on file    Attends religious service: Not on file    Active member of club or organization: Not on file    Attends meetings of clubs or organizations: Not on file    Relationship status: Not on file  . Intimate partner violence:    Fear of current or ex partner: Not on file    Emotionally  abused: Not on file    Physically abused: Not on file    Forced sexual activity: Not on file  Other Topics Concern  . Not on file  Social History Narrative  . Not on file   Family History  Problem Relation Age of Onset  . Diabetes Mother   . Hyperlipidemia Mother   . Cancer Sister   . Diabetes Sister   . Heart disease Sister   . Diabetes Maternal Grandmother   . Diabetes Maternal Grandfather   . Diabetes Paternal Grandmother   . Diabetes Paternal Glynda JaegerGrandfather        Eliyah Mcshea, MD 01/12/18 [phone]

## 2017-12-26 ENCOUNTER — Telehealth (HOSPITAL_COMMUNITY): Payer: Self-pay

## 2017-12-26 LAB — URINE CULTURE: Culture: >=100000 — AB

## 2017-12-26 NOTE — Telephone Encounter (Signed)
Urine culture was positive for Klebsiella Pneumoniae and was given Keflex at urgent care visit. Pt contacted and made aware, educated on completing antibiotic and to follow up if symptoms are persistent. Verbalized understanding.

## 2018-01-15 ENCOUNTER — Ambulatory Visit (HOSPITAL_COMMUNITY)
Admission: EM | Admit: 2018-01-15 | Discharge: 2018-01-15 | Disposition: A | Discharge status: Home / Self Care | Payer: BLUE CROSS/BLUE SHIELD

## 2018-01-15 ENCOUNTER — Other Ambulatory Visit: Payer: Self-pay

## 2018-01-15 ENCOUNTER — Encounter (HOSPITAL_COMMUNITY): Payer: Self-pay | Admitting: Emergency Medicine

## 2018-01-15 DIAGNOSIS — M79631 Pain in right forearm: Secondary | ICD-10-CM

## 2018-01-15 DIAGNOSIS — M79641 Pain in right hand: Secondary | ICD-10-CM | POA: Diagnosis not present

## 2018-01-15 DIAGNOSIS — M25531 Pain in right wrist: Secondary | ICD-10-CM

## 2018-01-15 MED ORDER — METHYLPREDNISOLONE ACETATE 80 MG/ML IJ SUSP
80.0000 mg | Freq: Once | INTRAMUSCULAR | Status: AC
  Administered 2018-01-15: 80 mg via INTRAMUSCULAR

## 2018-01-15 MED ORDER — METHYLPREDNISOLONE ACETATE 80 MG/ML IJ SUSP
INTRAMUSCULAR | Status: AC
  Filled 2018-01-15: qty 1

## 2018-01-15 NOTE — Discharge Instructions (Signed)
I suspect that your pain is inflammatory in nature due to overuse from your excessive work activity.  I recommend that you rest over the weekend.  The injection of Depo-Medrol is a strong steroid and is good for anti-inflammatory properties.  I recommend you set up office visit with your PCP so that you can continue getting treatment for your wrist and forearm pain.  In the meantime you can use a medication like Tylenol at 500 mg once every 6 hours for pain and inflammation.

## 2018-01-15 NOTE — ED Triage Notes (Addendum)
3-4 day history of right wrist and forearm pain.  No known injury.   Patient requests a hard copy script

## 2018-01-15 NOTE — ED Provider Notes (Signed)
  MRN: [id number] DOB: 06/22/1986  Subjective:   Patricia [name] is a 32 y.o. female presenting for 4-day history of worsening right hand, wrist, forearm pain.  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  She has tried meloxicam 7.5 mg with minimal to moderate relief.  However she has a history of gastritis and this medicine is difficult on her stomach.  Denies fever, falls, trauma.  Patient is right-handed.  No current facility-administered medications for this encounter.   Current Outpatient Medications:  .  meloxicam (MOBIC) 7.5 MG tablet, Take 7.5 mg by mouth daily., Disp: , Rfl:  .  cephALEXin (KEFLEX) 500 MG capsule, Take 1 capsule (500 mg total) by mouth 2 (two) times daily., Disp: 10 capsule, Rfl: 0 .  phenazopyridine (PYRIDIUM) 200 MG tablet, Take 1 tablet (200 mg total) by mouth 3 (three) times daily., Disp: 6 tablet, Rfl: 0   No Known Allergies  Past Medical History:  Diagnosis Date  . Asthma   . Gastritis      Past Surgical History:  Procedure Laterality Date  . CERVIX SURGERY    . GYNECOLOGIC CRYOSURGERY  2015    Objective:   Vitals: BP 108/74 (BP Location: Left Arm)   Pulse 72   Temp 98.9 F (37.2 C) (Oral)   Resp 18   LMP 01/12/2018   SpO2 96%   Physical Exam  Constitutional: She is oriented to person, place, and time. She appears well-developed and well-nourished.  Cardiovascular: Normal rate.  Pulmonary/Chest: Effort normal.  Musculoskeletal:       Right wrist: She exhibits decreased range of motion (flexion, extension) and tenderness (over volar surface). She exhibits no bony tenderness, no swelling, no effusion, no crepitus, no deformity and no laceration.       Right forearm: She exhibits tenderness. She exhibits no bony tenderness, no swelling, no edema, no deformity and no laceration.       Right hand: She exhibits decreased range of motion (full flexion of hand), tenderness and swelling  (trace). She exhibits no bony tenderness, normal capillary refill, no deformity and no laceration. Normal sensation noted. Normal strength noted.       Hands: Neurological: She is alert and oriented to person, place, and time.   Assessment and Plan :   Right hand pain  Right wrist pain  Right forearm pain  We will manage with IM Depo-Medrol for inflammatory process secondary to overuse from the nature of her work.  Counseled on rest, patient is to recheck with her PCP for continued management.     Wallis BambergMani, Keaten Mashek, PA-C 01/15/18 1504

## 2018-03-24 ENCOUNTER — Encounter: Payer: Self-pay | Admitting: Emergency Medicine

## 2018-03-24 ENCOUNTER — Ambulatory Visit: Payer: BLUE CROSS/BLUE SHIELD | Admitting: Urgent Care

## 2018-03-24 ENCOUNTER — Encounter: Payer: Self-pay | Admitting: Urgent Care

## 2018-03-24 VITALS — BP 122/71 | HR 76 | Temp 97.8°F | Resp 16 | Ht 59.0 in | Wt 180.0 lb

## 2018-03-24 DIAGNOSIS — L819 Disorder of pigmentation, unspecified: Secondary | ICD-10-CM | POA: Diagnosis not present

## 2018-03-24 DIAGNOSIS — R300 Dysuria: Secondary | ICD-10-CM

## 2018-03-24 DIAGNOSIS — N3090 Cystitis, unspecified without hematuria: Secondary | ICD-10-CM | POA: Diagnosis not present

## 2018-03-24 LAB — POCT URINALYSIS DIP (MANUAL ENTRY)
Blood, UA: NEGATIVE
Glucose, UA: 100 mg/dL — AB
LEUKOCYTES UA: NEGATIVE
Nitrite, UA: POSITIVE — AB
Protein Ur, POC: 30 mg/dL — AB
Spec Grav, UA: 1.02 (ref 1.010–1.025)
Urobilinogen, UA: 1 E.U./dL
pH, UA: 7.5 (ref 5.0–8.0)

## 2018-03-24 LAB — POCT URINE PREGNANCY: Preg Test, Ur: NEGATIVE

## 2018-03-24 MED ORDER — CIPROFLOXACIN HCL 500 MG PO TABS: 500.0000 mg | ORAL_TABLET | Freq: Two times a day (BID) | ORAL | 0 refills | Status: AC

## 2018-03-24 NOTE — Patient Instructions (Addendum)
Infeccin de las vas urinarias, en adultos  Urinary Tract Infection, Adult  Una infeccin de las vas urinarias (IVU) es una infeccin en cualquier parte de las vas urinarias, que incluyen los riones, los urteres, la vejiga y la uretra. Estos rganos fabrican, almacenan y eliminan la orina del organismo. La IVU puede ser una infeccin de la vejiga (cistitis) o una infeccin renal (pielonefritis).  Cules son las causas?  Esta infeccin puede deberse a hongos, virus o bacterias. Las bacterias son la causa ms comunes de las IVU. Esta afeccin tambin puede ser provocada por no vaciar la vejiga por completo durante la miccin en repetidas ocasiones.  Qu incrementa el riesgo?  Es ms probable que esta afeccin se manifieste si:   Usted ignora la necesidad de orinar o retiene la orina durante mucho tiempo.   No vaca la vejiga completamente durante la miccin.   Es una mujer y se limpia de atrs hacia adelante despus de orinar o defecar.   Es un hombre y est circuncidado.   Tiene estreimiento.   Tiene colocado un catter urinario (sonda urinaria) permanente.   Tiene debilitado el sistema de defensa (inmunitario) del cuerpo.   Tiene una enfermedad que afecta los intestinos, los riones o la vejiga.   Tiene diabetes.   Toma antibiticos con frecuencia o durante largos perodos, y los antibiticos ya no resultan eficaces para combatir algunos tipos de infecciones (resistencia a los antibiticos).   Toma medicamentos que le irritan las vas urinarias.   Est expuesto a sustancias qumicas que le irritan las vas urinarias.   Es mujer.    Cules son los signos o los sntomas?  Los sntomas de esta afeccin incluyen lo siguiente:   Fiebre.   Miccin frecuente o eliminacin de pequeas cantidades de orina con frecuencia.   Necesidad urgente de orinar.   Ardor o dolor al orinar.   Orina con mal olor u olor atpico.   Orina turbia.   Dolor en la parte baja del abdomen o en la espalda.   Dificultad  para orinar.   Presencia de sangre en la orina.   Tener vmitos o menos apetito de lo normal.   Diarrea o dolor abdominal.   Secrecin vaginal, si es mujer.    Cmo se diagnostica?  Esta afeccin se diagnostica con base en la historia clnica y un examen fsico. Tambin deber proporcionar una muestra de orina para realizar anlisis. Podrn indicarle otros estudios, por ejemplo:   Anlisis de sangre.   Anlisis de enfermedades de transmisin sexual (ETS).    Si ha tenido ms de una IVU, se pueden hacer estudios de diagnstico por imgenes o una cistoscopia para determinar la causa de las infecciones.  Cmo se trata?  El tratamiento de esta afeccin suele incluir una combinacin de dos o ms de los siguientes:   Antibiticos.   Otros medicamentos para tratar causas menos frecuentes de IVU.   Medicamentos de venta libre para aliviar el dolor.   Cantidad suficiente agua para mantenerse hidratado.    Siga estas indicaciones en su casa:   Tome los medicamentos de venta libre y los recetados solamente como se lo haya indicado el mdico.   Si le recetaron un antibitico, tmelo como se lo haya indicado el mdico. No deje de tomar el antibitico aunque comience a sentirse mejor.   Evite el alcohol, la cafena, el t y las bebidas gaseosas. Estas bebidas pueden irritar la vejiga.   Beba suficiente lquido para mantener la orina clara o de   sexuales. ? Limpiarse de adelante hacia atrs despus de defecar, si es mujer. Usar cada trozo de papel higinico solo una vez cuando se limpie. Comunquese con un mdico si:  Siente dolor en la espalda.  Tiene fiebre.  Siente nuseas o vomita.  Los sntomas  no mejoran despus de 3das de [place].  Los sntomas desaparecen y luego vuelven a Research officer, trade union. Solicite ayuda de inmediato si:  Siente dolor intenso en la espalda o en la zona inferior del abdomen.  Tiene vmitos y no puede tragar los medicamentos ni tomar agua. Esta informacin no tiene Theme park manager el consejo del mdico. Asegrese de hacerle al mdico cualquier pregunta que tenga. Document Released: 04/10/2005 Document Revised: 10/16/2016 Document Reviewed: 05/22/2015 Elsevier Interactive Patient Education  [employer].     If you have lab work done today you will be contacted with your lab results within the next 2 weeks.  If you have not heard from [place] then please contact us. The fastest way to get your results is to register for My Chart.   IF you received an x-ray today, you will receive an invoice from [facility]. Please contact [facility] at [phone] with questions or concerns regarding your invoice.   IF you received labwork today, you will receive an invoice from [facility]. Please contact [facility] at [phone] with questions or concerns regarding your invoice.   Our billing staff will not be able to assist you with questions regarding bills from these companies.  You will be contacted with the lab results as soon as they are available. The fastest way to get your results is to activate your My Chart account. Instructions are located on the last page of this paperwork. If you have not heard from [place] regarding the results in 2 weeks, please contact this office.

## 2018-03-24 NOTE — Progress Notes (Signed)
   MRN: [id number] DOB: 1986/02/25  Subjective:   Patricia [name] is a 32 y.o. female presenting for 1 day history of dysuria and urinary frequency. Also has had pelvic discomfort. Has tried Uristat with some relief. Denies fever, hematuria, urinary urgency, flank pain, abdominal pain, genital rash and vaginal discharge, nausea and vomiting, cloudy urine. Patient is also concerned that she has had discoloration of her skin over her right posterior hip/upper buttock. Denies pain, tenderness, drainage of pus or bleeding. She did receive a Depomedrol injection to the site 01/2018.   Patricia [name] has a current medication list which includes the following prescription(s): meloxicam. Patient has No Known Allergies. Patricia [name]  has a past medical history of Asthma and Gastritis. Also  has a past surgical history that includes Cervix surgery and Gynecologic cryosurgery (2015).  Objective:   Vitals: BP 122/71   Pulse 76   Temp 97.8 F (36.6 C) (Oral)   Resp 16   Ht 4\' 11"  (1.499 m)   Wt 180 lb (81.6 kg)   SpO2 98%   BMI 36.36 kg/m   Physical Exam  Constitutional: She is oriented to person, place, and time. She appears well-developed and well-nourished.  Cardiovascular: Normal rate, regular rhythm, normal heart sounds and intact distal pulses. Exam reveals no gallop and no friction rub.  No murmur heard. Pulmonary/Chest: Effort normal and breath sounds normal. No stridor. No respiratory distress. She has no wheezes. She has no rales.  Abdominal: Soft. Bowel sounds are normal. She exhibits no distension and no mass. There is no tenderness. There is no rebound and no guarding.  No CVA tenderness.  Musculoskeletal: She exhibits no edema.  Neurological: She is alert and oriented to person, place, and time.  Skin: Skin is warm and dry. No rash noted. No erythema. No pallor.      Results for orders placed or performed in visit on 03/24/18 (from the past 24 hour(s))  POCT urinalysis dipstick     Status:  Abnormal   Collection Time: 03/24/18  3:15 PM  Result Value Ref Range   Color, UA orange (A) yellow   Clarity, UA cloudy (A) clear   Glucose, UA =100 (A) negative mg/dL   Bilirubin, UA small (A) negative   Ketones, POC UA trace (5) (A) negative mg/dL   Spec Grav, UA 9.449 6.759 - 1.025   Blood, UA negative negative   pH, UA 7.5 5.0 - 8.0   Protein Ur, POC =30 (A) negative mg/dL   Urobilinogen, UA 1.0 0.2 or 1.0 E.U./dL   Nitrite, UA Positive (A) Negative   Leukocytes, UA Negative Negative  POCT urine pregnancy     Status: None   Collection Time: 03/24/18  3:16 PM  Result Value Ref Range   Preg Test, Ur Negative Negative   Assessment and Plan :   Recurrent cystitis  Dysuria - Plan: POCT urinalysis dipstick, Urine Microscopic, Urine Culture, POCT urine pregnancy, CANCELED: POCT Microscopic Urinalysis ([facility])  Discoloration of skin  Start ciprofloxacin, urine culture pending. Counseled on urinary changes including daily adequate hydration, wiping front to back and not holding urine. Provided her with a work letter to let her use the bathroom as needed. Will monitor resolving ecchymosis.   Wallis Bamberg, PA-C [facility] [phone] 03/24/2018 3:04 PM

## 2018-03-25 LAB — URINALYSIS, MICROSCOPIC ONLY: Casts: NONE SEEN /lpf

## 2018-03-26 LAB — URINE CULTURE
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
## 2018-08-10 ENCOUNTER — Ambulatory Visit: Payer: Self-pay | Admitting: *Deleted

## 2018-08-10 NOTE — Telephone Encounter (Signed)
Patient complains of severe chest pain- even though she is 33 yo- her pain is on left at heart and she has arm pain associated. Per protocol- patient advised ED- she does not want to go.  Call used interpreter-Rosanna.   Reason for Disposition . SEVERE chest pain  Answer Assessment - Initial Assessment Questions 1. LOCATION: "Where does it hurt?"       Left at heart area 2. RADIATION: "Does the pain go anywhere else?" (e.g., into neck, jaw, arms, back)     Stays in heart area- left arm sometimes feels heavy 3. ONSET: "When did the chest pain begin?" (Minutes, hours or days)      3 days ago- last week with personal issues 4. PATTERN "Does the pain come and go, or has it been constant since it started?"  "Does it get worse with exertion?"      Comes and goes, continues with work- does not get worse 5. DURATION: "How long does it last" (e.g., seconds, minutes, hours)     Hours- Saturday it was all day and today- 3 hours 6. SEVERITY: "How bad is the pain?"  (e.g., Scale 1-10; mild, moderate, or severe)    - MILD (1-3): doesn't interfere with normal activities     - MODERATE (4-7): interferes with normal activities or awakens from sleep    - SEVERE (8-10): excruciating pain, unable to do any normal activities       7-8 7. CARDIAC RISK FACTORS: "Do you have any history of heart problems or risk factors for heart disease?" (e.g., prior heart attack, angina; high blood pressure, diabetes, being overweight, high cholesterol, smoking, or strong family history of heart disease)     No- sister has murmer, mother has BP problems 8. PULMONARY RISK FACTORS: "Do you have any history of lung disease?"  (e.g., blood clots in lung, asthma, emphysema, birth control pills)     Asthma history 9. CAUSE: "What do you think is causing the chest pain?"     Stress- when patient was younger she had costochondritis  10. OTHER SYMPTOMS: "Do you have any other symptoms?" (e.g., dizziness, nausea, vomiting, sweating,  fever, difficulty breathing, cough)       Headaches- migraines  11. PREGNANCY: "Is there any chance you are pregnant?" "When was your last menstrual period?"       No- LMP- 2 weeks ago  Protocols used: CHEST PAIN-A-AH

## 2018-08-12 ENCOUNTER — Other Ambulatory Visit: Payer: Self-pay

## 2018-08-12 ENCOUNTER — Emergency Department (HOSPITAL_COMMUNITY)
Admission: EM | Admit: 2018-08-12 | Discharge: 2018-08-12 | Disposition: A | Discharge status: Home / Self Care | Payer: BLUE CROSS/BLUE SHIELD | Attending: Emergency Medicine | Admitting: Emergency Medicine

## 2018-08-12 DIAGNOSIS — J45909 Unspecified asthma, uncomplicated: Secondary | ICD-10-CM | POA: Insufficient documentation

## 2018-08-12 DIAGNOSIS — R002 Palpitations: Secondary | ICD-10-CM | POA: Diagnosis present

## 2018-08-12 NOTE — ED Provider Notes (Signed)
Emergency Department Provider Note   I have reviewed the triage vital signs and the nursing notes.   HISTORY  Chief Complaint Chest Pain and Palpitations   HPI Patricia [name] is a [age] y.o. female without significant past medical history the presents to the emergency department today with palpitations and chest pain.  Patient states over last few days she is been significantly stressed out for multiple personal reasons and will feel pressure in her chest followed by palpitations.  She will feel very stressed during this but no shortness of breath, diaphoresis, but does feel little bit of lightheadedness.  Lower extremity swelling or recent estrogen use.  No recent travels or surgeries. No other associated or modifying symptoms.    Past Medical History:  Diagnosis Date  . Asthma   . Gastritis     Patient Active Problem List   Diagnosis Date Noted  . Contact dermatitis and eczema 08/15/2017  . History of abnormal cervical Pap smear 08/15/2017  . Eczema of both hands 08/15/2017  . Weight gain 07/14/2017  . Frequent nosebleeds 07/14/2017  . Chronic nonintractable headache 07/14/2017  . Chronic neck pain 07/14/2017    Past Surgical History:  Procedure Laterality Date  . CERVIX SURGERY    . GYNECOLOGIC CRYOSURGERY  2015    Current Outpatient Rx  . Order #: [id number] Class: Normal  . Order #: [id number] Class: Historical Med    Allergies Patient has no known allergies.  Family History  Problem Relation Age of Onset  . Diabetes Mother   . Hyperlipidemia Mother   . Cancer Sister   . Diabetes Sister   . Heart disease Sister   . Diabetes Maternal Grandmother   . Diabetes Maternal Grandfather   . Diabetes Paternal Grandmother   . Diabetes Paternal Grandfather     Social History Social History   Tobacco Use  . Smoking status: Never Smoker  . Smokeless tobacco: Never Used  Substance Use Topics  . Alcohol use: No  . Drug use: Never    Review of Systems  All  other systems negative except as documented in the HPI. All pertinent positives and negatives as reviewed in the HPI. ____________________________________________   PHYSICAL EXAM:  VITAL SIGNS: ED Triage Vitals  Enc Vitals Group     BP 08/12/18 1432 110/72     Pulse Rate 08/12/18 1432 73     Resp 08/12/18 1432 16     Temp 08/12/18 1432 98.2 F (36.8 C)     Temp Source 08/12/18 1432 Oral     SpO2 08/12/18 1432 100 %     Weight 08/12/18 1429 155 lb (70.3 kg)     Height 08/12/18 1429 5' (1.524 m)    Constitutional: Alert and oriented. Well appearing and in no acute distress. Eyes: Conjunctivae are normal. PERRL. EOMI. Head: Atraumatic. Nose: No congestion/rhinnorhea. Mouth/Throat: Mucous membranes are moist.  Oropharynx non-erythematous. Neck: No stridor.  No meningeal signs.   Cardiovascular: Normal rate, regular rhythm. Good peripheral circulation. Grossly normal heart sounds.   Respiratory: Normal respiratory effort.  No retractions. Lungs CTAB. Gastrointestinal: Soft and nontender. No distention.  Musculoskeletal: No lower extremity tenderness nor edema. No gross deformities of extremities. Neurologic:  Normal speech and language. No gross focal neurologic deficits are appreciated.  Skin:  Skin is warm, dry and intact. No rash noted.   ____________________________________________   LABS (all labs ordered are listed, but only abnormal results are displayed)  Labs Reviewed - No data to display ____________________________________________  EKG  EKG Interpretation  Date/Time:    Ventricular Rate:    PR Interval:    QRS Duration:   QT Interval:    QTC Calculation:   R Axis:     Text Interpretation:        My ECG Read Indication:palpitations EKG was personally contemporaneously reviewed by myself. Rate: 75 PR Interval: 118 QRS duration: 86 QT/QTC: 384/428 Axis: normal EKG: normal EKG, normal sinus rhythm, unchanged from previous tracings. Other  significant findings: none  ____________________________________________  INITIAL IMPRESSION / ASSESSMENT AND PLAN / ED COURSE  Suspect palpitations.  Less suspicion for ACS, PE, pneumothorax or other significant etiology.  Patient without any symptoms requiring x-ray.  EKG is normal.  Stable for discharge with symptomatic advice and cardiology follow-up as needed.     Pertinent labs & imaging results that were available during my care of the patient were reviewed by me and considered in my medical decision making (see chart for details).  ____________________________________________  FINAL CLINICAL IMPRESSION(S) / ED DIAGNOSES  Final diagnoses:  Palpitations     MEDICATIONS GIVEN DURING THIS VISIT:  Medications - No data to display   NEW OUTPATIENT MEDICATIONS STARTED DURING THIS VISIT:  New Prescriptions   No medications on file    Note:  This note was prepared with assistance of Dragon voice recognition software. Occasional wrong-word or sound-a-like substitutions may have occurred due to the inherent limitations of voice recognition software.   Marily Memos, MD 08/12/18 1536

## 2018-08-12 NOTE — ED Triage Notes (Signed)
Pt here for evaluation of three episodes of palpitations and constant chest pain with numbness in L arm for the last three days. Sts it feels worse when she's stressed or rushed.

## 2022-04-27 ENCOUNTER — Encounter (HOSPITAL_COMMUNITY): Payer: Self-pay

## 2022-04-27 ENCOUNTER — Ambulatory Visit (HOSPITAL_COMMUNITY)
Admission: EM | Admit: 2022-04-27 | Discharge: 2022-04-27 | Disposition: A | Discharge status: Home / Self Care | Payer: Medicaid Other | Attending: Emergency Medicine | Admitting: Emergency Medicine

## 2022-04-27 DIAGNOSIS — J069 Acute upper respiratory infection, unspecified: Secondary | ICD-10-CM | POA: Diagnosis not present

## 2022-04-27 MED ORDER — PROMETHAZINE-DM 6.25-15 MG/5ML PO SYRP: 5.0000 mL | ORAL_SOLUTION | Freq: Four times a day (QID) | ORAL | 0 refills | Status: AC | PRN

## 2022-04-27 MED ORDER — AMOXICILLIN 500 MG PO CAPS: 500.0000 mg | ORAL_CAPSULE | Freq: Two times a day (BID) | ORAL | 0 refills | Status: AC

## 2022-04-27 MED ORDER — BENZONATATE 100 MG PO CAPS: 100.0000 mg | ORAL_CAPSULE | Freq: Three times a day (TID) | ORAL | 0 refills | Status: AC

## 2022-04-27 NOTE — ED Provider Notes (Signed)
[facility]    CSN: [id number] Arrival date & time: 04/27/22  1413      History   Chief Complaint Chief Complaint  Patient presents with   Sore Throat    Family of 2   Otalgia   Nasal Congestion    HPI Patricia [name] is a 36 y.o. female.   Patient presents with nasal congestion, right-sided ear pain, rhinorrhea, sore throat, hoarseness and a productive cough for 6 days.  Known sick contact in household.  Tolerating food and liquids.  Has attempted use of Tylenol and over-the-counter congestion medicine which was ineffective.  History of asthma.  Denies shortness of breath, wheezing, fever, chills.   Past Medical History:  Diagnosis Date   Asthma    Gastritis     Patient Active Problem List   Diagnosis Date Noted   Contact dermatitis and eczema 08/15/2017   History of abnormal cervical Pap smear 08/15/2017   Eczema of both hands 08/15/2017   Weight gain 07/14/2017   Frequent nosebleeds 07/14/2017   Chronic nonintractable headache 07/14/2017   Chronic neck pain 07/14/2017    Past Surgical History:  Procedure Laterality Date   CERVIX SURGERY     GYNECOLOGIC CRYOSURGERY  2015    OB History     Gravida  1   Para  1   Term      Preterm      AB      Living  1      SAB      IAB      Ectopic      Multiple      Live Births               Home Medications    Prior to Admission medications   Medication Sig Start Date End Date Taking? Authorizing Provider  ciprofloxacin (CIPRO) 500 MG tablet Take 1 tablet (500 mg total) by mouth 2 (two) times daily. 03/24/18   Jaynee Eagles, PA-C  meloxicam (MOBIC) 7.5 MG tablet Take 7.5 mg by mouth daily.    [provider]    Family History Family History  Problem Relation Age of Onset   Diabetes Mother    Hyperlipidemia Mother    Cancer Sister    Diabetes Sister    Heart disease Sister    Diabetes Maternal Grandmother    Diabetes Maternal Grandfather    Diabetes Paternal  Grandmother    Diabetes Paternal Grandfather     Social History Social History   Tobacco Use   Smoking status: Never   Smokeless tobacco: Never  Vaping Use   Vaping Use: Never used  Substance Use Topics   Alcohol use: No   Drug use: Never     Allergies   Latex   Review of Systems Review of Systems  Constitutional: Negative.   HENT:  Positive for congestion, ear pain, rhinorrhea, sore throat and voice change. Negative for dental problem, drooling, ear discharge, facial swelling, hearing loss, mouth sores, nosebleeds, postnasal drip, sinus pressure, sinus pain, sneezing, tinnitus and trouble swallowing.   Respiratory:  Positive for cough. Negative for apnea, choking, chest tightness, shortness of breath, wheezing and stridor.   Cardiovascular: Negative.   Gastrointestinal: Negative.   Skin: Negative.   Neurological: Negative.      Physical Exam Triage Vital Signs ED Triage Vitals  Enc Vitals Group     BP 04/27/22 1455 107/68     Pulse Rate 04/27/22 1455 100     Resp 04/27/22  1455 18     Temp 04/27/22 1455 97.8 F (36.6 C)     Temp Source 04/27/22 1455 Oral     SpO2 04/27/22 1455 98 %     Weight --      Height --      Head Circumference --      Peak Flow --      Pain Score 04/27/22 1437 9     Pain Loc --      Pain Edu? --      Excl. in [place]? --    No data found.  Updated Vital Signs BP 107/68 (BP Location: Left Arm)   Pulse 100   Temp 97.8 F (36.6 C) (Oral)   Resp 18   LMP 04/23/2022 (Approximate)   SpO2 98%   Visual Acuity Right Eye Distance:   Left Eye Distance:   Bilateral Distance:    Right Eye Near:   Left Eye Near:    Bilateral Near:     Physical Exam Constitutional:      Appearance: She is well-developed.  HENT:     Head: Normocephalic.     Right Ear: Tympanic membrane and ear canal normal.     Left Ear: Tympanic membrane and ear canal normal.     Nose: Congestion and rhinorrhea present.     Mouth/Throat:     Mouth: Mucous  membranes are moist.     Pharynx: Posterior oropharyngeal erythema present.     Tonsils: No tonsillar exudate. 0 on the right. 0 on the left.  Cardiovascular:     Rate and Rhythm: Normal rate and regular rhythm.     Heart sounds: Normal heart sounds.  Pulmonary:     Effort: Pulmonary effort is normal.     Breath sounds: Normal breath sounds.  Musculoskeletal:     Cervical back: Normal range of motion and neck supple.  Skin:    General: Skin is warm and dry.  Neurological:     General: No focal deficit present.     Mental Status: She is alert and oriented to person, place, and time.  Psychiatric:        Mood and Affect: Mood normal.        Behavior: Behavior normal.      UC Treatments / Results  Labs (all labs ordered are listed, but only abnormal results are displayed) Labs Reviewed - No data to display  EKG   Radiology No results found.  Procedures Procedures (including critical care time)  Medications Ordered in UC Medications - No data to display  Initial Impression / Assessment and Plan / UC Course  I have reviewed the triage vital signs and the nursing notes.  Pertinent labs & imaging results that were available during my care of the patient were reviewed by me and considered in my medical decision making (see chart for details).  Acute upper respiratory infection  Patient is in no signs of distress nor toxic appearing.  Vital signs are stable.  Low suspicion for pneumonia, pneumothorax or bronchitis and therefore will defer imaging.  Prescribed oxacillin as symptoms are persisting without signs of improvement, prescribed Tessalon and Promethazine DM as cough is most worrisome symptom today   May use additional over-the-counter medications as needed for supportive care.  May follow-up with urgent care as needed if symptoms persist or worsen.  Final Clinical Impressions(s) / UC Diagnoses   Final diagnoses:  None   Discharge Instructions   None    ED  Prescriptions  None    PDMP not reviewed this encounter.   Hans Eden, [place] 04/27/22 [phone]

## 2022-04-27 NOTE — Discharge Instructions (Signed)
Begin use amoxicillin every morning and every evening for 7 days, ideally will begin to see improvement after 24 to 48 hours of medication use  May use cough syrup every 6 hours as needed for comfort, be mindful this medication will make you drowsy  May use Tessalon pill every 8 hours as needed to calm coughing    You can take Tylenol and/or Ibuprofen as needed for fever reduction and pain relief.   For cough: honey 1/2 to 1 teaspoon (you can dilute the honey in water or another fluid).  You can also use guaifenesin and dextromethorphan for cough. You can use a humidifier for chest congestion and cough.  If you don't have a humidifier, you can sit in the bathroom with the hot shower running.      For sore throat: try warm salt water gargles, cepacol lozenges, throat spray, warm tea or water with lemon/honey, popsicles or ice, or OTC cold relief medicine for throat discomfort.   For congestion: take a daily anti-histamine like Zyrtec, Claritin, and a oral decongestant, such as pseudoephedrine.  You can also use Flonase 1-2 sprays in each nostril daily.   It is important to stay hydrated: drink plenty of fluids (water, gatorade/powerade/pedialyte, juices, or teas) to keep your throat moisturized and help further relieve irritation/discomfort.

## 2022-04-27 NOTE — ED Triage Notes (Signed)
Patient having right ear pain, headache, hoarseness, and sore throat onset Monday night.
